# Patient Record
Sex: Male | Born: 1970
Health system: Southern US, Community
[De-identification: ages and names within clinical notes are randomized; demographics above are authoritative.]

## PROBLEM LIST (undated history)

## (undated) DIAGNOSIS — M791 Myalgia, unspecified site: Secondary | ICD-10-CM

## (undated) DIAGNOSIS — T7840XA Allergy, unspecified, initial encounter: Secondary | ICD-10-CM

## (undated) DIAGNOSIS — J302 Other seasonal allergic rhinitis: Secondary | ICD-10-CM

## (undated) DIAGNOSIS — M2550 Pain in unspecified joint: Secondary | ICD-10-CM

## (undated) DIAGNOSIS — K219 Gastro-esophageal reflux disease without esophagitis: Secondary | ICD-10-CM

## (undated) HISTORY — DX: Other seasonal allergic rhinitis: J30.2

## (undated) HISTORY — PX: UPPER GASTROINTESTINAL ENDOSCOPY: SHX188

## (undated) HISTORY — PX: COLONOSCOPY: SHX174

## (undated) HISTORY — DX: Myalgia, unspecified site: M79.10

## (undated) HISTORY — DX: Allergy, unspecified, initial encounter: T78.40XA

## (undated) HISTORY — DX: Gastro-esophageal reflux disease without esophagitis: K21.9

## (undated) HISTORY — DX: Pain in unspecified joint: M25.50

---

## 1998-09-01 HISTORY — PX: NASAL SINUS SURGERY: SHX719

## 2015-11-07 LAB — HM HIV SCREENING LAB: HM HIV Screening: NEGATIVE

## 2016-11-08 LAB — HEPATIC FUNCTION PANEL
ALT: 16 (ref 10–40)
AST: 14 (ref 14–40)
Bilirubin, Total: 0.4

## 2016-11-08 LAB — LIPID PANEL
Cholesterol: 157 (ref 0–200)
HDL: 60 (ref 35–70)
LDL Cholesterol: 82
LDl/HDL Ratio: 1.4
Triglycerides: 74 (ref 40–160)

## 2016-11-08 LAB — BASIC METABOLIC PANEL
BUN: 10 (ref 4–21)
Creatinine: 1.2 (ref 0.6–1.3)
Glucose: 82
Potassium: 4.6 (ref 3.4–5.3)
Sodium: 141 (ref 137–147)

## 2016-11-08 LAB — HEMOGLOBIN A1C: Hemoglobin A1C: 5.1

## 2018-05-21 DIAGNOSIS — H5213 Myopia, bilateral: Secondary | ICD-10-CM | POA: Diagnosis not present

## 2018-05-21 DIAGNOSIS — H40053 Ocular hypertension, bilateral: Secondary | ICD-10-CM | POA: Diagnosis not present

## 2018-08-19 ENCOUNTER — Ambulatory Visit (INDEPENDENT_AMBULATORY_CARE_PROVIDER_SITE_OTHER): Payer: 59 | Admitting: Family Medicine

## 2018-08-19 ENCOUNTER — Encounter: Payer: Self-pay | Admitting: Family Medicine

## 2018-08-19 VITALS — BP 134/90 | HR 72 | Temp 99.1°F | Resp 16 | Ht 73.0 in | Wt 249.0 lb

## 2018-08-19 DIAGNOSIS — M7061 Trochanteric bursitis, right hip: Secondary | ICD-10-CM

## 2018-08-19 DIAGNOSIS — M5416 Radiculopathy, lumbar region: Secondary | ICD-10-CM

## 2018-08-19 DIAGNOSIS — K219 Gastro-esophageal reflux disease without esophagitis: Secondary | ICD-10-CM | POA: Insufficient documentation

## 2018-08-19 DIAGNOSIS — R030 Elevated blood-pressure reading, without diagnosis of hypertension: Secondary | ICD-10-CM

## 2018-08-19 MED ORDER — PREDNISONE 10 MG PO TABS: ORAL_TABLET | ORAL | 0 refills | Status: DC

## 2018-08-19 NOTE — Progress Notes (Signed)
Subjective  CC:   Chief Complaint  Patient presents with  . Establish Care  . Leg Pain    Right side, takes IBU  . Gastroesophageal Reflux    Takes Nexium daily, states without his symptoms are worse  . Back Pain    right side, Takes IBU    HPI: Tristan Merritt is a 47 y.o. male who presents to [place] at [facility] today to establish care with me as a new patient.  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  Resides with his girlfriend. Has 2 sons who live with his ex-wife in [place].  He has the following concerns or needs:  Chronic dysphagia treated with nexium and well controlled. Has had GI eval back in early 2000s with normal EGD with "increased acid production". Since, has required PPI daily. Has tried tums and zantac with little relief. No weight loss, melena, persistent sxs if takes meds. He reports his diet is very healthy for the last year; he has a stressful job. Rare alcohol. Had same problems when was much thinner (during weight loss due to stress of divorce though).  Elevated BP: keeps a log at home: consistent normal readings: 120s/70s-80s. Has strong FH.   C/o 7 months of back and right hip pain. Started with pain at right hip and lateral thigh. Now with right low back pain that radiates to calf. No weakness. No b/b dysfunction. No injury. Sits at work but also walks a lot on concrete floors. Hasn't taken anything for pain. Right hip pain will awaken him from sleep if he rolls on that side.   HM: overdue for CPE; imms are up to date. Reports had TDap at prior PCP about 3 years ago. Will request old records.   Depression screen PHQ 2/9 08/19/2018  Decreased Interest 0  Down, Depressed, Hopeless 0  PHQ - 2 Score 0  Altered sleeping 2  Tired, decreased energy 2  Change in appetite 0  Feeling bad or failure about yourself  0  Trouble concentrating 0  Moving slowly or  fidgety/restless 0  Suicidal thoughts 0  PHQ-9 Score 4  Difficult doing work/chores Somewhat difficult    Assessment  1. GERD without esophagitis   2. White coat syndrome without diagnosis of hypertension   3. Greater trochanteric bursitis of right hip   4. Lumbar radiculitis      Plan   Chronic GERD/dysphagia. Controlled on PPI. Discussed risks vs benefits. rec continuing for now, weight loss, and f/u with GI if wants another opinion.   Back and hip pain: discussed multifactorial: lumbar radiculopathy vs sciatica, hip busitis and tight fascia lata; start stretching icing and steroid taper. Tylenol if needed as well. Recheck 3 weeks. No red flag sxs present. Discussed.   Continue to monitor bp outside of the office.   Follow up:  Return in about 4 weeks (around 09/16/2018) for recheck, complete physical at pt's convenienc. No orders of the defined types were placed in this encounter.  Meds ordered this encounter  Medications  . predniSONE (DELTASONE) 10 MG tablet    Sig: Take 4 tabs qd x 2 days, 3 qd x 2 days, 2 qd x 2d, 1qd x 3 days    Dispense:  21 tablet    Refill:  0     Depression screen [facility] 2/9 08/19/2018  Decreased Interest 0  Down, Depressed, Hopeless 0  PHQ - 2 Score 0  Altered sleeping  2  Tired, decreased energy 2  Change in appetite 0  Feeling bad or failure about yourself  0  Trouble concentrating 0  Moving slowly or fidgety/restless 0  Suicidal thoughts 0  PHQ-9 Score 4  Difficult doing work/chores Somewhat difficult    We updated and reviewed the patient's past history in detail and it is documented below.  Patient Active Problem List   Diagnosis Date Noted  . GERD without esophagitis 08/19/2018    EGD in early 2000s; normal. On PPI; reports problems swallowing if stops PPI   . White coat syndrome without diagnosis of hypertension 08/19/2018   Health Maintenance  Topic Date Due  . HIV Screening  11/28/1984  . TETANUS/TDAP  08/19/2025  .  INFLUENZA VACCINE  Completed   Immunization History  Administered Date(s) Administered  . Influenza,inj,Quad PF,6+ Mos 07/14/2018  . Tdap 08/20/2015   Current Meds  Medication Sig  . cetirizine (ZYRTEC) 10 MG tablet Take 10 mg by mouth daily.  Marland Kitchen esomeprazole (NEXIUM) 20 MG capsule Take 20 mg by mouth daily at 12 noon.    Allergies: Patient is allergic to biaxin [clarithromycin] and sulfa antibiotics. Past Medical History Patient  has a past medical history of GERD (gastroesophageal reflux disease) and Seasonal allergies. Past Surgical History Patient  has a past surgical history that includes Nasal sinus surgery (2000). Family History: Patient family history includes Diabetes in his father; Glaucoma in his brother; HIV in his brother; Healthy in his son and son; Heart disease in his maternal grandfather; Hypertension in his father and paternal grandmother; Lymphoma in his mother; Multiple myeloma in his maternal grandmother; Stroke in his father and paternal grandmother. Social History:  Patient  reports that he has never smoked. He has never used smokeless tobacco. He reports current alcohol use of about 1.0 - 2.0 standard drinks of alcohol per week. He reports that he does not use drugs.  Review of Systems: Constitutional: negative for fever or malaise Ophthalmic: negative for photophobia, double vision or loss of vision Cardiovascular: negative for chest pain, dyspnea on exertion, or new LE swelling Respiratory: negative for SOB or persistent cough Gastrointestinal: negative for abdominal pain, change in bowel habits or melena Genitourinary: negative for dysuria or gross hematuria Musculoskeletal: negative for new gait disturbance or muscular weakness Integumentary: negative for new or persistent rashes Neurological: negative for TIA or stroke symptoms Psychiatric: negative for SI or delusions Allergic/Immunologic: negative for hives  Patient Care Team    Relationship  Specialty Notifications Start End  Leamon Arnt, MD PCP - General Family Medicine  08/19/18     Objective  Vitals: BP 134/90   Pulse 72   Temp 99.1 F (37.3 C) (Oral)   Resp 16   Ht _0  (1.854 m)   Wt 249 lb (112.9 kg)   SpO2 98%   BMI 32.85 kg/m  General:  Well developed, well nourished, no acute distress  Psych:  Alert and oriented,normal mood and affect HEENT:  Normocephalic, atraumatic, non-icteric sclera, PERRL, oropharynx is without mass or exudate, supple neck without adenopathy, mass or thyromegaly Cardiovascular:  RRR without gallop, rub or murmur Respiratory:  Good breath sounds bilaterally, CTAB with normal respiratory effort Gastrointestinal: normal bowel sounds, soft, non-tender, no noted masses. No HSM MSK: no deformities, contusions. Joints are without erythema or swelling + right gr troch bursa ttp, full ROM back; right paraverterbral ttp, no sciatic notch ttp. Normal strength BLE and 5/5 knee reflexes and heel cord reflexes Skin:  Warm, no  rashes or suspicious lesions noted Neurologic:    Mental status is normal. Gross motor and sensory exams are normal. Normal gait    Commons side effects, risks, benefits, and alternatives for medications and treatment plan prescribed today were discussed, and the patient expressed understanding of the given instructions. Patient is instructed to call or message via MyChart if he/she has any questions or concerns regarding our treatment plan. No barriers to understanding were identified. We discussed Red Flag symptoms and signs in detail. Patient expressed understanding regarding what to do in case of urgent or emergency type symptoms.   Medication list was reconciled, printed and provided to the patient in AVS. Patient instructions and summary information was reviewed with the patient as documented in the AVS. This note was prepared with assistance of Dragon voice recognition software. Occasional wrong-word or sound-a-like  substitutions may have occurred due to the inherent limitations of voice recognition software

## 2018-08-19 NOTE — Patient Instructions (Signed)
Please return in 3 weeks to recheck back and leg pain.  Also can schedule a CPE at your convenience, please come fasting.   It was a pleasure meeting you today! Thank you for choosing us to meet your healthcare needs! I truly look forward to working with you. If you have any questions or concerns, please send me a message via Mychart or call the office at [phone].   Sciatica  Sciatica is pain, numbness, weakness, or tingling along the path of the sciatic nerve. The sciatic nerve starts in the lower back and runs down the back of each leg. The nerve controls the muscles in the lower leg and in the back of the knee. It also provides feeling (sensation) to the back of the thigh, the lower leg, and the sole of the foot. Sciatica is a symptom of another medical condition that pinches or puts pressure on the sciatic nerve. Generally, sciatica only affects one side of the body. Sciatica usually goes away on its own or with treatment. In some cases, sciatica may keep coming back (recur). What are the causes? This condition is caused by pressure on the sciatic nerve, or pinching of the sciatic nerve. This may be the result of:  A disk in between the bones of the spine (vertebrae) bulging out too far (herniated disk).  Age-related changes in the spinal disks (degenerative disk disease).  A pain disorder that affects a muscle in the buttock (piriformis syndrome).  Extra bone growth (bone spur) near the sciatic nerve.  An injury or break (fracture) of the pelvis.  Pregnancy.  Tumor (rare). What increases the risk? The following factors may make you more likely to develop this condition:  Playing sports that place pressure or stress on the spine, such as football or weight lifting.  Having poor strength and flexibility.  A history of back injury.  A history of back surgery.  Sitting for long periods of time.  Doing activities that involve repetitive bending or  lifting.  Obesity. What are the signs or symptoms? Symptoms can vary from mild to very severe, and they may include:  Any of these problems in the lower back, leg, hip, or buttock: ? Mild tingling or dull aches. ? Burning sensations. ? Sharp pains.  Numbness in the back of the calf or the sole of the foot.  Leg weakness.  Severe back pain that makes movement difficult. These symptoms may get worse when you cough, sneeze, or laugh, or when you sit or stand for long periods of time. Being overweight may also make symptoms worse. In some cases, symptoms may recur over time. How is this diagnosed? This condition may be diagnosed based on:  Your symptoms.  A physical exam. Your health care provider may ask you to do certain movements to check whether those movements trigger your symptoms.  You may have tests, including: ? Blood tests. ? X-rays. ? MRI. ? CT scan. How is this treated? In many cases, this condition improves on its own, without any treatment. However, treatment may include:  Reducing or modifying physical activity during periods of pain.  Exercising and stretching to strengthen your abdomen and improve the flexibility of your spine.  Icing and applying heat to the affected area.  Medicines that help: ? To relieve pain and swelling. ? To relax your muscles.  Injections of medicines that help to relieve pain, irritation, and inflammation around the sciatic nerve (steroids).  Surgery. Follow these instructions at home: Medicines  Take over-the-counter  and prescription medicines only as told by your health care provider.  Do not drive or operate heavy machinery while taking prescription pain medicine. Managing pain  If directed, apply ice to the affected area. ? Put ice in a plastic bag. ? Place a towel between your skin and the bag. ? Leave the ice on for 20 minutes, 2-3 times a day.  After icing, apply heat to the affected area before you exercise or as  often as told by your health care provider. Use the heat source that your health care provider recommends, such as a moist heat pack or a heating pad. ? Place a towel between your skin and the heat source. ? Leave the heat on for 20-30 minutes. ? Remove the heat if your skin turns bright red. This is especially important if you are unable to feel pain, heat, or cold. You may have a greater risk of getting burned. Activity  Return to your normal activities as told by your health care provider. Ask your health care provider what activities are safe for you. ? Avoid activities that make your symptoms worse.  Take brief periods of rest throughout the day. Resting in a lying or standing position is usually better than sitting to rest. ? When you rest for longer periods, mix in some mild activity or stretching between periods of rest. This will help to prevent stiffness and pain. ? Avoid sitting for long periods of time without moving. Get up and move around at least one time each hour.  Exercise and stretch regularly, as told by your health care provider.  Do not lift anything that is heavier than 10 lb (4.5 kg) while you have symptoms of sciatica. When you do not have symptoms, you should still avoid heavy lifting, especially repetitive heavy lifting.  When you lift objects, always use proper lifting technique, which includes: ? Bending your knees. ? Keeping the load close to your body. ? Avoiding twisting. General instructions  Use good posture. ? Avoid leaning forward while sitting. ? Avoid hunching over while standing.  Maintain a healthy weight. Excess weight puts extra stress on your back and makes it difficult to maintain good posture.  Wear supportive, comfortable shoes. Avoid wearing high heels.  Avoid sleeping on a mattress that is too soft or too hard. A mattress that is firm enough to support your back when you sleep may help to reduce your pain.  Keep all follow-up visits as  told by your health care provider. This is important. Contact a health care provider if:  You have pain that wakes you up when you are sleeping.  You have pain that gets worse when you lie down.  Your pain is worse than you have experienced in the past.  Your pain lasts longer than 4 weeks.  You experience unexplained weight loss. Get help right away if:  You lose control of your bowel or bladder (incontinence).  You have: ? Weakness in your lower back, pelvis, buttocks, or legs that gets worse. ? Redness or swelling of your back. ? A burning sensation when you urinate. This information is not intended to replace advice given to you by your health care provider. Make sure you discuss any questions you have with your health care provider. Document Released: 08/12/2001 Document Revised: 01/22/2016 Document Reviewed: 04/27/2015 Elsevier Interactive Patient Education  2019 Elsevier Inc.  Hip Bursitis  Hip bursitis is inflammation of a fluid-filled sac (bursa) in the hip joint. The bursa protects the bones  in the hip joint from rubbing against each other. Hip bursitis can cause mild to moderate pain, and symptoms often come and go over time. What are the causes? This condition may be caused by:  Injury to the hip.  Overuse of the muscles that surround the hip joint.  Arthritis or gout.  Diabetes.  Thyroid disease.  Cold weather.  Infection. In some cases, the cause may not be known. What are the signs or symptoms? Symptoms of this condition may include:  Mild or moderate pain in the hip area. Pain may get worse with movement.  Tenderness and swelling of the hip, especially on the outer side of the hip. Symptoms may come and go. If the bursa becomes infected, you may have the following symptoms:  Fever.  Red skin and a feeling of warmth in the hip area. How is this diagnosed? This condition may be diagnosed based on:  A physical exam.  Your medical  history.  X-rays.  Removal of fluid from your inflamed bursa for testing (biopsy). You may be sent to a health care provider who specializes in bone diseases (orthopedist) or a provider who specializes in joint inflammation (rheumatologist). How is this treated? This condition is treated by resting, raising (elevating), and applying pressure(compression) to the injured area. In some cases, this may be enough to make your symptoms go away. Treatment may also include:  Crutches.  Antibiotic medicine.  Draining fluid out of the bursa to help relieve swelling.  Injecting medicine that helps to reduce inflammation (cortisone). Follow these instructions at home: Medicines  Take over-the-counter and prescription medicines only as told by your health care provider.  Do not drive or operate heavy machinery while taking prescription pain medicine, or as told by your health care provider.  If you were prescribed an antibiotic, take it as told by your health care provider. Do not stop taking the antibiotic even if you start to feel better. Activity  Return to your normal activities as told by your health care provider. Ask your health care provider what activities are safe for you.  Rest and protect your hip as much as possible until your pain and swelling get better. General instructions  Wear compression wraps only as told by your health care provider.  Elevate your hip above the level of your heart as much as you can without pain. To do this, try putting a pillow under your hips while you lie down.  Do not use your hip to support your body weight until your health care provider says that you can. Use crutches as told by your health care provider.  Gently massage and stretch your injured area as often as is comfortable.  Keep all follow-up visits as told by your health care provider. This is important. How is this prevented?  Exercise regularly, as told by your health care  provider.  Warm up and stretch before being active.  Cool down and stretch after being active.  If an activity irritates your hip or causes pain, avoid the activity as much as possible.  Avoid sitting down for long periods at a time. Contact a health care provider if:  You have a fever.  You develop new symptoms.  You have difficulty walking or doing everyday activities.  You have pain that gets worse or does not get better with medicine.  You develop red skin or a feeling of warmth in your hip area. Get help right away if:  You cannot move your hip.  You have severe pain. This information is not intended to replace advice given to you by your health care provider. Make sure you discuss any questions you have with your health care provider. Document Released: 02/07/2002 Document Revised: 01/24/2016 Document Reviewed: 03/20/2015 Elsevier Interactive Patient Education  2019 [employer] Inc.   Back Exercises The following exercises strengthen the muscles that help to support the back. They also help to keep the lower back flexible. Doing these exercises can help to prevent back pain or lessen existing pain. If you have back pain or discomfort, try doing these exercises 2-3 times each day or as told by your health care provider. When the pain goes away, do them once each day, but increase the number of times that you repeat the steps for each exercise (do more repetitions). If you do not have back pain or discomfort, do these exercises once each day or as told by your health care provider. Exercises Single Knee to Chest Repeat these steps 3-5 times for each leg: 1. Lie on your back on a firm bed or the floor with your legs extended. 2. Bring one knee to your chest. Your other leg should stay extended and in contact with the floor. 3. Hold your knee in place by grabbing your knee or thigh. 4. Pull on your knee until you feel a gentle stretch in your lower back. 5. Hold the stretch  for 10-30 seconds. 6. Slowly release and straighten your leg. Pelvic Tilt Repeat these steps 5-10 times: 1. Lie on your back on a firm bed or the floor with your legs extended. 2. Bend your knees so they are pointing toward the ceiling and your feet are flat on the floor. 3. Tighten your lower abdominal muscles to press your lower back against the floor. This motion will tilt your pelvis so your tailbone points up toward the ceiling instead of pointing to your feet or the floor. 4. With gentle tension and even breathing, hold this position for 5-10 seconds. Cat-Cow Repeat these steps until your lower back becomes more flexible: 1. Get into a hands-and-knees position on a firm surface. Keep your hands under your shoulders, and keep your knees under your hips. You may place padding under your knees for comfort. 2. Let your head hang down, and point your tailbone toward the floor so your lower back becomes rounded like the back of a cat. 3. Hold this position for 5 seconds. 4. Slowly lift your head and point your tailbone up toward the ceiling so your back forms a sagging arch like the back of a cow. 5. Hold this position for 5 seconds.  Press-Ups Repeat these steps 5-10 times: 1. Lie on your abdomen (face-down) on the floor. 2. Place your palms near your head, about shoulder-width apart. 3. While you keep your back as relaxed as possible and keep your hips on the floor, slowly straighten your arms to raise the top half of your body and lift your shoulders. Do not use your back muscles to raise your upper torso. You may adjust the placement of your hands to make yourself more comfortable. 4. Hold this position for 5 seconds while you keep your back relaxed. 5. Slowly return to lying flat on the floor.  Bridges Repeat these steps 10 times: 1. Lie on your back on a firm surface. 2. Bend your knees so they are pointing toward the ceiling and your feet are flat on the floor. 3. Tighten your  buttocks muscles and lift your  buttocks off of the floor until your waist is at almost the same height as your knees. You should feel the muscles working in your buttocks and the back of your thighs. If you do not feel these muscles, slide your feet 1-2 inches farther away from your buttocks. 4. Hold this position for 3-5 seconds. 5. Slowly lower your hips to the starting position, and allow your buttocks muscles to relax completely. If this exercise is too easy, try doing it with your arms crossed over your chest. Abdominal Crunches Repeat these steps 5-10 times: 1. Lie on your back on a firm bed or the floor with your legs extended. 2. Bend your knees so they are pointing toward the ceiling and your feet are flat on the floor. 3. Cross your arms over your chest. 4. Tip your chin slightly toward your chest without bending your neck. 5. Tighten your abdominal muscles and slowly raise your trunk (torso) high enough to lift your shoulder blades a tiny bit off of the floor. Avoid raising your torso higher than that, because it can put too much stress on your low back and it does not help to strengthen your abdominal muscles. 6. Slowly return to your starting position. Back Lifts Repeat these steps 5-10 times: 1. Lie on your abdomen (face-down) with your arms at your sides, and rest your forehead on the floor. 2. Tighten the muscles in your legs and your buttocks. 3. Slowly lift your chest off of the floor while you keep your hips pressed to the floor. Keep the back of your head in line with the curve in your back. Your eyes should be looking at the floor. 4. Hold this position for 3-5 seconds. 5. Slowly return to your starting position. Contact a health care provider if:  Your back pain or discomfort gets much worse when you do an exercise.  Your back pain or discomfort does not lessen within 2 hours after you exercise. If you have any of these problems, stop doing these exercises right away. Do  not do them again unless your health care provider says that you can. Get help right away if:  You develop sudden, severe back pain. If this happens, stop doing the exercises right away. Do not do them again unless your health care provider says that you can. This information is not intended to replace advice given to you by your health care provider. Make sure you discuss any questions you have with your health care provider. Document Released: 09/25/2004 Document Revised: 12/22/2017 Document Reviewed: 10/12/2014 Elsevier Interactive Patient Education  [employer]2019 [employer]

## 2018-09-08 ENCOUNTER — Encounter: Payer: Self-pay | Admitting: *Deleted

## 2018-09-08 NOTE — Progress Notes (Signed)
[id number] 

## 2018-09-14 ENCOUNTER — Ambulatory Visit (INDEPENDENT_AMBULATORY_CARE_PROVIDER_SITE_OTHER): Payer: 59 | Admitting: Family Medicine

## 2018-09-14 ENCOUNTER — Encounter: Payer: Self-pay | Admitting: Family Medicine

## 2018-09-14 ENCOUNTER — Other Ambulatory Visit: Payer: Self-pay

## 2018-09-14 VITALS — BP 126/80 | HR 68 | Temp 98.0°F | Resp 16 | Ht 73.0 in | Wt 252.4 lb

## 2018-09-14 DIAGNOSIS — R030 Elevated blood-pressure reading, without diagnosis of hypertension: Secondary | ICD-10-CM

## 2018-09-14 DIAGNOSIS — Z0000 Encounter for general adult medical examination without abnormal findings: Secondary | ICD-10-CM

## 2018-09-14 DIAGNOSIS — M5416 Radiculopathy, lumbar region: Secondary | ICD-10-CM | POA: Diagnosis not present

## 2018-09-14 DIAGNOSIS — M7061 Trochanteric bursitis, right hip: Secondary | ICD-10-CM | POA: Diagnosis not present

## 2018-09-14 LAB — CBC WITH DIFFERENTIAL/PLATELET
Basophils Absolute: 0 10*3/uL (ref 0.0–0.1)
Basophils Relative: 0.8 % (ref 0.0–3.0)
Eosinophils Absolute: 0.2 10*3/uL (ref 0.0–0.7)
Eosinophils Relative: 4 % (ref 0.0–5.0)
HCT: 41.7 % (ref 39.0–52.0)
Hemoglobin: 14.1 g/dL (ref 13.0–17.0)
Lymphocytes Relative: 32.1 % (ref 12.0–46.0)
Lymphs Abs: 1.8 10*3/uL (ref 0.7–4.0)
MCHC: 33.8 g/dL (ref 30.0–36.0)
MCV: 86.8 fl (ref 78.0–100.0)
Monocytes Absolute: 0.5 10*3/uL (ref 0.1–1.0)
Monocytes Relative: 8.4 % (ref 3.0–12.0)
Neutro Abs: 3.1 10*3/uL (ref 1.4–7.7)
Neutrophils Relative %: 54.7 % (ref 43.0–77.0)
Platelets: 278 10*3/uL (ref 150.0–400.0)
RBC: 4.81 Mil/uL (ref 4.22–5.81)
RDW: 13.1 % (ref 11.5–15.5)
WBC: 5.7 10*3/uL (ref 4.0–10.5)

## 2018-09-14 LAB — COMPREHENSIVE METABOLIC PANEL
ALT: 19 U/L (ref 0–53)
AST: 16 U/L (ref 0–37)
Albumin: 4.4 g/dL (ref 3.5–5.2)
Alkaline Phosphatase: 58 U/L (ref 39–117)
BUN: 18 mg/dL (ref 6–23)
CO2: 28 mEq/L (ref 19–32)
Calcium: 9.3 mg/dL (ref 8.4–10.5)
Chloride: 102 mEq/L (ref 96–112)
Creatinine, Ser: 1.13 mg/dL (ref 0.40–1.50)
GFR: 73.37 mL/min (ref >60.00)
Glucose, Bld: 104 mg/dL — ABNORMAL HIGH (ref 70–99)
Potassium: 4.1 mEq/L (ref 3.5–5.1)
Sodium: 139 mEq/L (ref 135–145)
Total Bilirubin: 0.5 mg/dL (ref 0.2–1.2)
Total Protein: 6.9 g/dL (ref 6.0–8.3)

## 2018-09-14 LAB — LIPID PANEL
Cholesterol: 185 mg/dL (ref 0–200)
HDL: 52.3 mg/dL (ref >39.00)
LDL Cholesterol: 119 mg/dL — ABNORMAL HIGH (ref 0–99)
NonHDL: 133.06
Total CHOL/HDL Ratio: 4
Triglycerides: 68 mg/dL (ref 0.0–149.0)
VLDL: 13.6 mg/dL (ref 0.0–40.0)

## 2018-09-14 MED ORDER — GABAPENTIN 300 MG PO CAPS: 300.0000 mg | ORAL_CAPSULE | Freq: Every day | ORAL | 0 refills | Status: DC

## 2018-09-14 NOTE — Patient Instructions (Signed)
Please return in 12 months for your annual complete physical; please come fasting.  I will release your lab results to you on your MyChart account with further instructions. Please reply with any questions.   We will call you with information regarding your referral appointment. [facility] for your back pain. If you do not hear from us within the next 2 weeks, please let me know. It can take 1-2 weeks to get appointments set up with the specialists.    If you have any questions or concerns, please don't hesitate to send me a message via MyChart or call the office at [phone]. Thank you for visiting with us today! It's our pleasure caring for you.  Please do these things to maintain good health!   Exercise at least 30-45 minutes a day,  4-5 days a week.   Eat a low-fat diet with lots of fruits and vegetables, up to 7-9 servings per day.  Drink plenty of water daily. Try to drink 8 8oz glasses per day.  Seatbelts can save your life. Always wear your seatbelt.  Place Smoke Detectors on every level of your home and check batteries every year.  Eye Doctor - have an eye exam every 1-2 years  Safe sex - use condoms to protect yourself from STDs if you could be exposed to these types of infections.  Avoid heavy alcohol use. If you drink, keep it to less than 2 drinks/day and not every day.  Health Care Power of Attorney.  Choose someone you trust that could speak for you if you became unable to speak for yourself.  Depression is common in our stressful world.If you're feeling down or losing interest in things you normally enjoy, please come in for a visit.

## 2018-09-14 NOTE — Progress Notes (Signed)
Subjective  Chief Complaint  Patient presents with  . Annual Exam    Pt is fasting  . Hip Pain    Right side, he states that while on prednisone pain improved, since completing TX his pain is starting to return. He has been doing the stretches.    HPI: Tristan Merritt is a 48 y.o. male who presents to [place] [facility] today for a Male Wellness Visit.   Wellness Visit: annual visit with health maintenance review and exam    Doing well. Working on eating better now that holidays are over. GERD is well controlled. Trying to minimize ibuprofen  Back and hip pain: did well with prednisone but sxs are returning; dull ache in low back and some radicular sxs w/o weakness. No stiffness or weakness.  Lifestyle: Body mass index is 33.3 kg/m. Wt Readings from Last 3 Encounters:  09/14/18 252 lb 6.4 oz (114.5 kg)  08/19/18 249 lb (112.9 kg)   Diet: low fat Exercise: intermittently,   Patient Active Problem List   Diagnosis Date Noted  . GERD without esophagitis 08/19/2018  . White coat syndrome without diagnosis of hypertension 08/19/2018   Health Maintenance  Topic Date Due  . TETANUS/TDAP  11/06/2025  . INFLUENZA VACCINE  Completed  . HIV Screening  Completed   Immunization History  Administered Date(s) Administered  . Influenza,inj,Quad PF,6+ Mos 07/14/2018  . Tdap 11/07/2015   We updated and reviewed the patient's past history in detail and it is documented below. Allergies: Patient is allergic to biaxin [clarithromycin] and sulfa antibiotics. Past Medical History  has a past medical history of GERD (gastroesophageal reflux disease) and Seasonal allergies. Past Surgical History  has a past surgical history that includes Nasal sinus surgery (2000). Social History Patient  reports that he has never smoked. He has never used smokeless tobacco. He reports current alcohol use of about 1.0 - 2.0 standard drinks of alcohol per week. He reports that he  does not use drugs. Family History Patient family history includes Diabetes in his father; Glaucoma in his brother; HIV in his brother; Healthy in his son and son; Heart disease in his maternal grandfather; Hypertension in his father and paternal grandmother; Lymphoma in his mother; Multiple myeloma in his maternal grandmother; Stroke in his father and paternal grandmother. Review of Systems: Constitutional: negative for fever or malaise Ophthalmic: negative for photophobia, double vision or loss of vision Cardiovascular: negative for chest pain, dyspnea on exertion, or new LE swelling Respiratory: negative for SOB or persistent cough Gastrointestinal: negative for abdominal pain, change in bowel habits or melena Genitourinary: negative for dysuria or gross hematuria Musculoskeletal: negative for new gait disturbance or muscular weakness Integumentary: negative for new or persistent rashes, no breast lumps Neurological: negative for TIA or stroke symptoms Psychiatric: negative for SI or delusions Allergic/Immunologic: negative for hives  Patient Care Team    Relationship Specialty Notifications Start End  Leamon Arnt, MD PCP - General Family Medicine  08/19/18    Objective  Vitals: BP 126/80   Pulse 68   Temp 98 F (36.7 C) (Oral)   Resp 16   Ht _0  (1.854 m)   Wt 252 lb 6.4 oz (114.5 kg)   SpO2 98%   BMI 33.30 kg/m  General:  Well developed, well nourished, no acute distress  Psych:  Alert and orientedx3,normal mood and affect HEENT:  Normocephalic, atraumatic, non-icteric sclera, PERRL, oropharynx is clear without mass or exudate, supple neck without adenopathy, mass  or thyromegaly Cardiovascular:  Normal S1, S2, RRR without gallop, rub or murmur, nondisplaced PMI, +2 distal pulses in bilateral upper and lower extremities. Respiratory:  Good breath sounds bilaterally, CTAB with normal respiratory effort Gastrointestinal: normal bowel sounds, soft, non-tender, no noted  masses. No HSM MSK: no deformities, contusions. Joints are without erythema or swelling. Spine and CVA region are nontender, neg SLR B, neg hip bursa ttp today Skin:  Warm, no rashes or suspicious lesions noted Neurologic:    Mental status is normal. CN 2-11 are normal. Gross motor and sensory exams are normal. Stable gait. No tremor GU: No inguinal hernias or adenopathy are appreciated bilaterally  Assessment  1. Annual physical exam   2. White coat syndrome without diagnosis of hypertension   3. Lumbar radiculitis   4. Greater trochanteric bursitis of right hip      Plan  Male Wellness Visit:  Age appropriate Health Maintenance and Prevention measures were discussed with patient. Included topics are cancer screening recommendations, ways to keep healthy (see AVS) including dietary and exercise recommendations, regular eye and dental care, use of seat belts, and avoidance of moderate alcohol use and tobacco use.   BMI: discussed patient's BMI and encouraged positive lifestyle modifications to help get to or maintain a target BMI.  HM needs and immunizations were addressed and ordered. See below for orders. See HM and immunization section for updates.  Routine labs and screening tests ordered including cmp, cbc and lipids where appropriate.  Discussed recommendations regarding Vit D and calcium supplementation (see AVS)  Refer to [facility] for eval of back and hip pain. May warrant PT as well. Start neurontin nightly.  Follow up: Return in about 1 year (around 09/15/2019) for complete physical.   Commons side effects, risks, benefits, and alternatives for medications and treatment plan prescribed today were discussed, and the patient expressed understanding of the given instructions. Patient is instructed to call or message via MyChart if he/she has any questions or concerns regarding our treatment plan. No barriers to understanding were identified. We discussed Red Flag symptoms and signs in  detail. Patient expressed understanding regarding what to do in case of urgent or emergency type symptoms.   Medication list was reconciled, printed and provided to the patient in AVS. Patient instructions and summary information was reviewed with the patient as documented in the AVS. This note was prepared with assistance of Dragon voice recognition software. Occasional wrong-word or sound-a-like substitutions may have occurred due to the inherent limitations of voice recognition software  Orders Placed This Encounter  Procedures  . CBC with Differential/Platelet  . Comprehensive metabolic panel  . Lipid panel  . HIV Antibody (routine testing w rflx)  . Ambulatory referral to Sports Medicine   Meds ordered this encounter  Medications  . gabapentin (NEURONTIN) 300 MG capsule    Sig: Take 1 capsule (300 mg total) by mouth at bedtime.    Dispense:  90 capsule    Refill:  0

## 2018-09-15 LAB — HIV ANTIBODY (ROUTINE TESTING W REFLEX): HIV 1&2 Ab, 4th Generation: NONREACTIVE

## 2018-09-17 ENCOUNTER — Encounter: Payer: Self-pay | Admitting: Family Medicine

## 2018-10-01 ENCOUNTER — Ambulatory Visit: Payer: 59 | Admitting: Family Medicine

## 2018-10-09 NOTE — Progress Notes (Signed)
[facility] [address], [place] [address] Phone: [phone] Subjective:   Tristan Merritt, am serving as a scribe for Dr. Hulan Saas.  I'm seeing this patient by the request  of:  Leamon Arnt, MD   CC: Hip pain  [id number]  Tristan Merritt is a 48 y.o. male coming in with complaint of right hip pain. Patient states that he has been having pain for 6-8 months in the right hip. States that his desk chair had plastic part that irritated the lateral hip. Patient also was trying to walk more during that time. Patient also notes pain the tibialis anterior that correlates with his hip pain. Pain is located over the GT. Has hard time sleeping due to pain. Used gabapentin which alleviated his pain for a few days. Does still use it but it isn't as effective he states. Does note that stretching helps to alleviate pain.         Past Medical History:  Diagnosis Date  . GERD (gastroesophageal reflux disease)   . Seasonal allergies    Past Surgical History:  Procedure Laterality Date  . NASAL SINUS SURGERY  2000   Remove cyst   Social History   Socioeconomic History  . Marital status: Divorced    Spouse name: girlfriend, Freddy Finner  . Number of children: 2  . Years of education: Not on file  . Highest education level: Not on file  Occupational History  . 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 Financial resource strain: Not on file  . Food insecurity:    Worry: Not on file    Inability: Not on file  . Transportation needs:    Medical: Not on file    Non-medical: Not on file  Tobacco Use  . Smoking status: Never Smoker  . Smokeless tobacco: Never Used  Substance and Sexual Activity  . Alcohol use: Yes    Alcohol/week: 1.0 - 2.0 standard drinks    Types: 1 - 2 Cans of beer per week  . Drug use: Never  . Sexual activity: Yes  Lifestyle  . Physical activity:    Days per week: Not on file      Minutes per session: Not on file  . Stress: Not on file  Relationships  . Social connections:    Talks on phone: Not on file    Gets together: Not on file    Attends religious service: Not on file    Active member of club or organization: Not on file    Attends meetings of clubs or organizations: Not on file    Relationship status: Not on file  Other Topics Concern  . Not on file  Social History Narrative  . Not on file   Allergies  Allergen Reactions  . Biaxin [Clarithromycin] Nausea And Vomiting  . Sulfa Antibiotics Hives   Family History  Problem Relation Age of Onset  . Lymphoma Mother   . Stroke Father   . Hypertension Father   . Diabetes Father        Borderline  . Multiple myeloma Maternal Grandmother   . Heart disease Maternal Grandfather   . Hypertension Paternal Grandmother   . Stroke Paternal Grandmother   . HIV Brother   . Glaucoma Brother   . Healthy Son   . Healthy Son       Current Outpatient Medications (Respiratory):  .  cetirizine (ZYRTEC) 10 MG tablet,  Take 10 mg by mouth daily.    Current Outpatient Medications (Other):  .  esomeprazole (NEXIUM) 20 MG capsule, Take 20 mg by mouth daily at 12 noon. .  gabapentin (NEURONTIN) 300 MG capsule, Take 1 capsule (300 mg total) by mouth at bedtime.    Past medical history, social, surgical and family history all reviewed in electronic medical record.  Merritt pertanent information unless stated regarding to the chief complaint.   Review of Systems:  Merritt headache, visual changes, nausea, vomiting, diarrhea, constipation, dizziness, abdominal pain, skin rash, fevers, chills, night sweats, weight loss, swollen lymph nodes, body aches, joint swelling, muscle aches, chest pain, shortness of breath, mood changes.   Objective  Blood pressure 122/82, pulse 91, height '6\' 1"'  (1.854 m), weight 259 lb (117.5 kg), SpO2 96 %.    General: Merritt apparent distress alert and oriented x3 mood and affect normal, dressed  appropriately.  HEENT: Pupils equal, extraocular movements intact  Respiratory: Patient's speak in full sentences and does not appear short of breath  Cardiovascular: Merritt lower extremity edema, non tender, Merritt erythema  Skin: Warm dry intact with Merritt signs of infection or rash on extremities or on axial skeleton.  Abdomen: Soft nontender  Neuro: Cranial nerves II through XII are intact, neurovascularly intact in all extremities with 2+ DTRs and 2+ pulses.  Lymph: Merritt lymphadenopathy of posterior or anterior cervical chain or axillae bilaterally.  Gait normal with good balance and coordination.  MSK:  Non tender with full range of motion and good stability and symmetric strength and tone of shoulders, elbows, wrist, hip, knee and ankles bilaterally.  Hip: Right ROM IR: 25 Deg, ER: 45 Deg, Flexion: 120 Deg, Extension: 100 Deg, Abduction: 45 Deg, Adduction: 45 Deg Strength IR: 5/5, ER: 5/5, Flexion: 5/5, Extension: 5/5, Abduction: 4/5, Adduction: 5/5 Pelvic alignment unremarkable to inspection and palpation. Standing hip rotation and gait without trendelenburg sign / unsteadiness. Greater trochanter without tenderness to palpation. Merritt tenderness over piriformis and greater trochanter. Merritt pain with FABER or FADIR. Merritt SI joint tenderness and normal minimal SI movement.  Procedure: Real-time Ultrasound Guided Injection of right hip Device: GE Logiq Q7  Ultrasound guided injection is preferred based studies that show increased duration, increased effect, greater accuracy, decreased procedural pain, increased response rate with ultrasound guided versus blind injection.  Verbal informed consent obtained.  Time-out conducted.  Noted Merritt overlying erythema, induration, or other signs of local infection.  Skin prepped in a sterile fashion.  Local anesthesia: Topical Ethyl chloride.  With sterile technique and under real time ultrasound guidance:  Anterior capsule visualized, needle visualized going to  the head neck junction at the anterior capsule. Pictures taken. Patient did have injection of  3 cc of 0.5% Marcaine, and 1 cc of Kenalog 40 mg/dL. Completed without difficulty  Pain immediately resolved suggesting accurate placement of the medication.  Advised to call if fevers/chills, erythema, induration, drainage, or persistent bleeding.  Images permanently stored and available for review in the ultrasound unit.  Impression: Technically successful ultrasound guided injection.  [id number]; 15 additional minutes spent for Therapeutic exercises as stated in above notes.  This included exercises focusing on stretching, strengthening, with significant focus on eccentric aspects.   Long term goals include an improvement in range of motion, strength, endurance as well as avoiding reinjury. Patient's frequency would include in 1-2 times a day, 3-5 times a week for a duration of 6-12 weeks. Hip strengthening exercises which included:  Pelvic tilt/bracing to help with proper  recruitment of the lower abs and pelvic floor muscles  Glute strengthening to properly contract glutes without over-engaging low back and hamstrings - prone hip extension and glute bridge exercises Proper stretching techniques to increase effectiveness for the hip flexors, groin, quads, piriformic and low back when appropriate    Proper technique shown and discussed handout in great detail with ATC.  All questions were discussed and answered.     Impression and Recommendations:     This case required medical decision making of moderate complexity. The above documentation has been reviewed and is accurate and complete Lyndal Pulley, DO       Note: This dictation was prepared with Dragon dictation along with smaller phrase technology. Any transcriptional errors that result from this process are unintentional.

## 2018-10-11 ENCOUNTER — Encounter: Payer: Self-pay | Admitting: Family Medicine

## 2018-10-11 ENCOUNTER — Ambulatory Visit: Payer: Self-pay

## 2018-10-11 ENCOUNTER — Ambulatory Visit (INDEPENDENT_AMBULATORY_CARE_PROVIDER_SITE_OTHER)
Admission: RE | Admit: 2018-10-11 | Discharge: 2018-10-11 | Disposition: A | Discharge status: Home / Self Care | Payer: 59 | Source: Ambulatory Visit | Attending: Family Medicine | Admitting: Family Medicine

## 2018-10-11 ENCOUNTER — Ambulatory Visit (INDEPENDENT_AMBULATORY_CARE_PROVIDER_SITE_OTHER): Payer: 59 | Admitting: Family Medicine

## 2018-10-11 VITALS — BP 122/82 | HR 91 | Ht 73.0 in | Wt 259.0 lb

## 2018-10-11 DIAGNOSIS — M7061 Trochanteric bursitis, right hip: Secondary | ICD-10-CM | POA: Insufficient documentation

## 2018-10-11 DIAGNOSIS — M25551 Pain in right hip: Secondary | ICD-10-CM

## 2018-10-11 DIAGNOSIS — M2142 Flat foot [pes planus] (acquired), left foot: Secondary | ICD-10-CM | POA: Diagnosis not present

## 2018-10-11 DIAGNOSIS — M2141 Flat foot [pes planus] (acquired), right foot: Secondary | ICD-10-CM | POA: Diagnosis not present

## 2018-10-11 DIAGNOSIS — M2140 Flat foot [pes planus] (acquired), unspecified foot: Secondary | ICD-10-CM | POA: Insufficient documentation

## 2018-10-11 NOTE — Patient Instructions (Signed)
Good to see you  Ice 20 minutes 2 times daily. Usually after activity and before bed. Exercises 3 times a week.  pennsaid pinkie amount topically 2 times daily as needed.  Continue the gabapentin  Stay active but things like biking, elliptical may be good.  Vitamin D 2000 Iu daily  See me again in 4-6 weeks

## 2018-10-11 NOTE — Assessment & Plan Note (Signed)
Discussed shoes.  Discussed icing regimen, discussed avoiding being barefoot and metatarsal longitudinal support.  Follow-up again in 4 weeks

## 2018-10-11 NOTE — Assessment & Plan Note (Signed)
Patient given injection.  Tolerated the procedure well.  Discussed icing regimen and home exercise.  Discussed which activities to do which will still avoid.  Increase activity slowly over the course of neck several days.  Work with Event organiser.  X-rays ordered today.  Discussed proper shoes and over-the-counter orthotics.  Follow-up again in 4 to 8 weeks

## 2018-11-15 ENCOUNTER — Ambulatory Visit: Payer: 59 | Admitting: Family Medicine

## 2018-12-08 ENCOUNTER — Other Ambulatory Visit: Payer: Self-pay | Admitting: Family Medicine

## 2018-12-12 NOTE — Progress Notes (Deleted)
[facility] [address], [place] [address] Phone: [phone] Subjective:    I'm seeing this patient by the request  of:    CC: Bilateral foot pain follow-up  [id number]  Tristan Merritt is a 48 y.o. male coming in with complaint of ***  Onset-  Location Duration-  Character- Aggravating factors- Reliving factors-  Therapies tried-  Severity-     Past Medical History:  Diagnosis Date  . GERD (gastroesophageal reflux disease)   . Seasonal allergies    Past Surgical History:  Procedure Laterality Date  . NASAL SINUS SURGERY  2000   Remove cyst   Social History   Socioeconomic History  . Marital status: Divorced    Spouse name: girlfriend, Freddy Finner  . Number of children: 2  . Years of education: Not on file  . Highest education level: Not on file  Occupational History  . 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 Financial resource strain: Not on file  . Food insecurity:    Worry: Not on file    Inability: Not on file  . Transportation needs:    Medical: Not on file    Non-medical: Not on file  Tobacco Use  . Smoking status: Never Smoker  . Smokeless tobacco: Never Used  Substance and Sexual Activity  . Alcohol use: Yes    Alcohol/week: 1.0 - 2.0 standard drinks    Types: 1 - 2 Cans of beer per week  . Drug use: Never  . Sexual activity: Yes  Lifestyle  . Physical activity:    Days per week: Not on file    Minutes per session: Not on file  . Stress: Not on file  Relationships  . Social connections:    Talks on phone: Not on file    Gets together: Not on file    Attends religious service: Not on file    Active member of club or organization: Not on file    Attends meetings of clubs or organizations: Not on file    Relationship status: Not on file  Other Topics Concern  . Not on file  Social History Narrative  . Not on file   Allergies  Allergen Reactions  .  Biaxin [Clarithromycin] Nausea And Vomiting  . Sulfa Antibiotics Hives   Family History  Problem Relation Age of Onset  . Lymphoma Mother   . Stroke Father   . Hypertension Father   . Diabetes Father        Borderline  . Multiple myeloma Maternal Grandmother   . Heart disease Maternal Grandfather   . Hypertension Paternal Grandmother   . Stroke Paternal Grandmother   . HIV Brother   . Glaucoma Brother   . Healthy Son   . Healthy Son       Current Outpatient Medications (Respiratory):  .  cetirizine (ZYRTEC) 10 MG tablet, Take 10 mg by mouth daily.    Current Outpatient Medications (Other):  .  esomeprazole (NEXIUM) 20 MG capsule, Take 20 mg by mouth daily at 12 noon. .  gabapentin (NEURONTIN) 300 MG capsule, TAKE ONE CAPSULE BY MOUTH AT BEDTIME    Past medical history, social, surgical and family history all reviewed in electronic medical record.  No pertanent information unless stated regarding to the chief complaint.   Review of Systems:  No headache, visual changes, nausea, vomiting, diarrhea, constipation, dizziness, abdominal pain, skin rash, fevers, chills, night sweats, weight loss, swollen  lymph nodes, body aches, joint swelling, muscle aches, chest pain, shortness of breath, mood changes.   Objective  There were no vitals taken for this visit. Systems examined below as of    General: No apparent distress alert and oriented x3 mood and affect normal, dressed appropriately.  HEENT: Pupils equal, extraocular movements intact  Respiratory: Patient's speak in full sentences and does not appear short of breath  Cardiovascular: No lower extremity edema, non tender, no erythema  Skin: Warm dry intact with no signs of infection or rash on extremities or on axial skeleton.  Abdomen: Soft nontender  Neuro: Cranial nerves II through XII are intact, neurovascularly intact in all extremities with 2+ DTRs and 2+ pulses.  Lymph: No lymphadenopathy of posterior or anterior  cervical chain or axillae bilaterally.  Gait normal with good balance and coordination.  MSK:  Non tender with full range of motion and good stability and symmetric strength and tone of shoulders, elbows, wrist, hip, knee and ankles bilaterally.     Impression and Recommendations:     This case required medical decision making of moderate complexity. The above documentation has been reviewed and is accurate and complete Lyndal Pulley, DO       Note: This dictation was prepared with Dragon dictation along with smaller phrase technology. Any transcriptional errors that result from this process are unintentional.

## 2018-12-13 ENCOUNTER — Encounter

## 2018-12-13 ENCOUNTER — Ambulatory Visit: Payer: 59 | Admitting: Family Medicine

## 2019-01-21 DIAGNOSIS — H40053 Ocular hypertension, bilateral: Secondary | ICD-10-CM | POA: Diagnosis not present

## 2019-03-23 ENCOUNTER — Encounter: Payer: Self-pay | Admitting: Family Medicine

## 2019-03-23 ENCOUNTER — Ambulatory Visit (INDEPENDENT_AMBULATORY_CARE_PROVIDER_SITE_OTHER): Payer: 59 | Admitting: Family Medicine

## 2019-03-23 VITALS — BP 122/78

## 2019-03-23 DIAGNOSIS — H8110 Benign paroxysmal vertigo, unspecified ear: Secondary | ICD-10-CM

## 2019-03-23 DIAGNOSIS — J302 Other seasonal allergic rhinitis: Secondary | ICD-10-CM | POA: Diagnosis not present

## 2019-03-23 NOTE — Progress Notes (Signed)
Virtual Visit via Video Note  Subjective  CC:  Chief Complaint  Patient presents with  . Dizziness    Switched from Zyrtec to Claritin last Wed, and dizziness started Saturday. Denies nausea, vomiting, and headaches. Dizziness first times in the morning and last about 3 seconds.      I connected with [name] on 03/23/19 at  3:20 PM EDT by a video enabled telemedicine application and verified that I am speaking with the correct person using two identifiers. Location patient: Home Location provider: [facility] [place], Office Persons participating in the virtual visit: Jaeson Molstad, Leamon Arnt, MD Lilli Light, [place] discussed the limitations of evaluation and management by telemedicine and the availability of in person appointments. The patient expressed understanding and agreed to proceed. HPI: Tristan Merritt is a 48 y.o. male who was contacted today to address the problems listed above in the chief complaint. . As above, fleeting vertigo associated with getting up from lying position x last 4 mornings. sxs resolve spontaneously, then feel fine. No n/v/ headaches, or neuro sxs. Feels well now. No recent URI illnesses. No ear pain or fullness. No balance problems. New problem.  . AR: trying claritin but not working as well as zyrtec did. He had been on zyrtec for years and thought it would be prudent to switch.   Assessment  1. Benign paroxysmal positional vertigo, unspecified laterality   2. Chronic seasonal allergic rhinitis      Plan   Mild bppv:  No red flags. Supportive care and reassurance. F/u if worsens.  AR: change back to zyrtec. Doubt change is related to sxs.  I discussed the assessment and treatment plan with the patient. The patient was provided an opportunity to ask questions and all were answered. The patient agreed with the plan and demonstrated an understanding of the instructions.   The patient was advised to  call back or seek an in-person evaluation if the symptoms worsen or if the condition fails to improve as anticipated. Follow up: No follow-ups on file.  09/16/2019  No orders of the defined types were placed in this encounter.     I reviewed the patients updated PMH, FH, and SocHx.    Patient Active Problem List   Diagnosis Date Noted  . Chronic seasonal allergic rhinitis 03/23/2019  . Greater trochanteric bursitis of right hip 10/11/2018  . Pes planus 10/11/2018  . GERD without esophagitis 08/19/2018  . White coat syndrome without diagnosis of hypertension 08/19/2018   Current Meds  Medication Sig  . esomeprazole (NEXIUM) 20 MG capsule Take 20 mg by mouth daily at 12 noon.  . gabapentin (NEURONTIN) 300 MG capsule TAKE ONE CAPSULE BY MOUTH AT BEDTIME  . loratadine (CLARITIN) 10 MG tablet Take 10 mg by mouth daily.  . magnesium 30 MG tablet Take 30 mg by mouth 2 (two) times daily.  . [DISCONTINUED] cetirizine (ZYRTEC) 10 MG tablet Take 10 mg by mouth daily.    Allergies: Patient is allergic to biaxin [clarithromycin] and sulfa antibiotics. Family History: Patient family history includes Diabetes in his father; Glaucoma in his brother; HIV in his brother; Healthy in his son and son; Heart disease in his maternal grandfather; Hypertension in his father and paternal grandmother; Lymphoma in his mother; Multiple myeloma in his maternal grandmother; Stroke in his father and paternal grandmother. Social History:  Patient  reports that he has never smoked. He has never used smokeless tobacco. He reports  current alcohol use of about 1.0 - 2.0 standard drinks of alcohol per week. He reports that he does not use drugs.  Review of Systems: Constitutional: Negative for fever malaise or anorexia Cardiovascular: negative for chest pain Respiratory: negative for SOB or persistent cough Gastrointestinal: negative for abdominal pain  OBJECTIVE Vitals: BP 122/78  General: no acute distress ,  A&Ox3  Leamon Arnt, MD

## 2019-06-07 ENCOUNTER — Other Ambulatory Visit: Payer: Self-pay | Admitting: Family Medicine

## 2019-09-16 ENCOUNTER — Encounter: Payer: 59 | Admitting: Family Medicine

## 2019-09-23 ENCOUNTER — Other Ambulatory Visit: Payer: Self-pay

## 2020-08-09 ENCOUNTER — Telehealth: Payer: Self-pay

## 2020-08-09 NOTE — Telephone Encounter (Signed)
Patient is calling asking to do a TOC from Dr.Andy to Dr.Parker as he would feel more comfortable with a male doctor.

## 2020-08-09 NOTE — Telephone Encounter (Signed)
sure

## 2020-08-09 NOTE — Telephone Encounter (Signed)
That is ok.  Tristan Merritt. [name], MD 08/09/2020 1:02 PM

## 2020-08-09 NOTE — Telephone Encounter (Signed)
LVM asking patient to call back.  

## 2020-08-19 ENCOUNTER — Encounter: Payer: Self-pay | Admitting: Family Medicine

## 2020-08-26 ENCOUNTER — Encounter: Payer: Self-pay | Admitting: Family Medicine

## 2020-10-22 ENCOUNTER — Encounter: Payer: Self-pay | Admitting: Family Medicine

## 2020-10-22 ENCOUNTER — Other Ambulatory Visit: Payer: Self-pay

## 2020-10-22 ENCOUNTER — Ambulatory Visit (INDEPENDENT_AMBULATORY_CARE_PROVIDER_SITE_OTHER): Payer: BC Managed Care – PPO | Admitting: Family Medicine

## 2020-10-22 ENCOUNTER — Other Ambulatory Visit: Payer: Self-pay | Admitting: Family Medicine

## 2020-10-22 VITALS — BP 122/81 | HR 83 | Temp 98.4°F | Ht 73.0 in | Wt 263.8 lb

## 2020-10-22 DIAGNOSIS — Z1211 Encounter for screening for malignant neoplasm of colon: Secondary | ICD-10-CM

## 2020-10-22 DIAGNOSIS — Z1159 Encounter for screening for other viral diseases: Secondary | ICD-10-CM

## 2020-10-22 DIAGNOSIS — H409 Unspecified glaucoma: Secondary | ICD-10-CM | POA: Diagnosis not present

## 2020-10-22 DIAGNOSIS — K219 Gastro-esophageal reflux disease without esophagitis: Secondary | ICD-10-CM | POA: Diagnosis not present

## 2020-10-22 DIAGNOSIS — M5412 Radiculopathy, cervical region: Secondary | ICD-10-CM

## 2020-10-22 DIAGNOSIS — Z13220 Encounter for screening for lipoid disorders: Secondary | ICD-10-CM

## 2020-10-22 DIAGNOSIS — R739 Hyperglycemia, unspecified: Secondary | ICD-10-CM

## 2020-10-22 DIAGNOSIS — Z0001 Encounter for general adult medical examination with abnormal findings: Secondary | ICD-10-CM

## 2020-10-22 DIAGNOSIS — Z23 Encounter for immunization: Secondary | ICD-10-CM

## 2020-10-22 DIAGNOSIS — E669 Obesity, unspecified: Secondary | ICD-10-CM | POA: Insufficient documentation

## 2020-10-22 LAB — CBC
HCT: 41.7 % (ref 39.0–52.0)
Hemoglobin: 14.1 g/dL (ref 13.0–17.0)
MCHC: 33.7 g/dL (ref 30.0–36.0)
MCV: 86.1 fl (ref 78.0–100.0)
Platelets: 280 10*3/uL (ref 150.0–400.0)
RBC: 4.84 Mil/uL (ref 4.22–5.81)
RDW: 13.2 % (ref 11.5–15.5)
WBC: 6.7 10*3/uL (ref 4.0–10.5)

## 2020-10-22 LAB — COMPREHENSIVE METABOLIC PANEL
ALT: 39 U/L (ref 0–53)
AST: 24 U/L (ref 0–37)
Albumin: 4.4 g/dL (ref 3.5–5.2)
Alkaline Phosphatase: 62 U/L (ref 39–117)
BUN: 15 mg/dL (ref 6–23)
CO2: 28 mEq/L (ref 19–32)
Calcium: 9.9 mg/dL (ref 8.4–10.5)
Chloride: 101 mEq/L (ref 96–112)
Creatinine, Ser: 1.09 mg/dL (ref 0.40–1.50)
GFR: 78.92 mL/min (ref >60.00)
Glucose, Bld: 78 mg/dL (ref 70–99)
Potassium: 4.1 mEq/L (ref 3.5–5.1)
Sodium: 139 mEq/L (ref 135–145)
Total Bilirubin: 0.5 mg/dL (ref 0.2–1.2)
Total Protein: 7.3 g/dL (ref 6.0–8.3)

## 2020-10-22 LAB — LIPID PANEL
Cholesterol: 179 mg/dL (ref 0–200)
HDL: 55.4 mg/dL (ref >39.00)
LDL Cholesterol: 109 mg/dL — ABNORMAL HIGH (ref 0–99)
NonHDL: 124
Total CHOL/HDL Ratio: 3
Triglycerides: 74 mg/dL (ref 0.0–149.0)
VLDL: 14.8 mg/dL (ref 0.0–40.0)

## 2020-10-22 LAB — MAGNESIUM: Magnesium: 2.1 mg/dL (ref 1.5–2.5)

## 2020-10-22 LAB — TSH: TSH: 1.67 u[IU]/mL (ref 0.35–4.50)

## 2020-10-22 LAB — HEMOGLOBIN A1C: Hgb A1c MFr Bld: 5.6 % (ref 4.6–6.5)

## 2020-10-22 MED ORDER — OZEMPIC (0.25 OR 0.5 MG/DOSE) 2 MG/1.5ML ~~LOC~~ SOPN: 0.2500 mg | PEN_INJECTOR | SUBCUTANEOUS | 0 refills | Status: DC

## 2020-10-22 NOTE — Patient Instructions (Signed)
It was very nice to see you today!  Please start the Ozempic.  Send me a message in a few weeks let me know how this is working for you and you can increase the dose as needed.  I will place a referral for you to see a sports medicine doctor for your thumb and neck.  We will give your flu vaccine today.  Please check with your insurance about the shingles vaccine.  We will check blood work today.  I would like to see back in about 3 months for follow-up on Ozempic and weight loss.  I will see you back in year for your next annual checkup.  Take care, Dr [name]  Please try these tips to maintain a healthy lifestyle:   Eat at least 3 REAL meals and 1-2 snacks per day.  Aim for no more than 5 hours between eating.  If you eat breakfast, please do so within one hour of getting up.    Each meal should contain half fruits/vegetables, one quarter protein, and one quarter carbs (no bigger than a computer mouse)   Cut down on sweet beverages. This includes juice, soda, and sweet tea.     Drink at least 1 glass of water with each meal and aim for at least 8 glasses per day   Exercise at least 150 minutes every week.    Preventive Care 31-49 Years Old, Male Preventive care refers to lifestyle choices and visits with your health care provider that can promote health and wellness. This includes:  A yearly physical exam. This is also called an annual wellness visit.  Regular dental and eye exams.  Immunizations.  Screening for certain conditions.  Healthy lifestyle choices, such as: ? Eating a healthy diet. ? Getting regular exercise. ? Not using drugs or products that contain nicotine and tobacco. ? Limiting alcohol use. What can I expect for my preventive care visit? Physical exam Your health care provider will check your:  Height and weight. These may be used to calculate your BMI (body mass index). BMI is a measurement that tells if you are at a healthy  weight.  Heart rate and blood pressure.  Body temperature.  Skin for abnormal spots. Counseling Your health care provider may ask you questions about your:  Past medical problems.  Family's medical history.  Alcohol, tobacco, and drug use.  Emotional well-being.  Home life and relationship well-being.  Sexual activity.  Diet, exercise, and sleep habits.  Work and work Astronomer.  Access to firearms. What immunizations do I need? Vaccines are usually given at various ages, according to a schedule. Your health care provider will recommend vaccines for you based on your age, medical history, and lifestyle or other factors, such as travel or where you work.   What tests do I need? Blood tests  Lipid and cholesterol levels. These may be checked every 5 years, or more often if you are over 67 years old.  Hepatitis C test.  Hepatitis B test. Screening  Lung cancer screening. You may have this screening every year starting at age 68 if you have a 30-pack-year history of smoking and currently smoke or have quit within the past 15 years.  Prostate cancer screening. Recommendations will vary depending on your family history and other risks.  Genital exam to check for testicular cancer or hernias.  Colorectal cancer screening. ? All adults should have this screening starting at age 12 and continuing until age 66. ? Your health care provider  may recommend screening at age 80 if you are at increased risk. ? You will have tests every 1-10 years, depending on your results and the type of screening test.  Diabetes screening. ? This is done by checking your blood sugar (glucose) after you have not eaten for a while (fasting). ? You may have this done every 1-3 years.  STD (sexually transmitted disease) testing, if you are at risk. Follow these instructions at home: Eating and drinking  Eat a diet that includes fresh fruits and vegetables, whole grains, lean protein, and  low-fat dairy products.  Take vitamin and mineral supplements as recommended by your health care provider.  Do not drink alcohol if your health care provider tells you not to drink.  If you drink alcohol: ? Limit how much you have to 0-2 drinks a day. ? Be aware of how much alcohol is in your drink. In the [place] one drink equals one 12 oz bottle of beer (355 mL), one 5 oz glass of wine (148 mL), or one 1 oz glass of hard liquor (44 mL).   Lifestyle  Take daily care of your teeth and gums. Brush your teeth every morning and night with fluoride toothpaste. Floss one time each day.  Stay active. Exercise for at least 30 minutes 5 or more days each week.  Do not use any products that contain nicotine or tobacco, such as cigarettes, e-cigarettes, and chewing tobacco. If you need help quitting, ask your health care provider.  Do not use drugs.  If you are sexually active, practice safe sex. Use a condom or other form of protection to prevent STIs (sexually transmitted infections).  If told by your health care provider, take low-dose aspirin daily starting at age 78.  Find healthy ways to cope with stress, such as: ? Meditation, yoga, or listening to music. ? Journaling. ? Talking to a trusted person. ? Spending time with friends and family. Safety  Always wear your seat belt while driving or riding in a vehicle.  Do not drive: ? If you have been drinking alcohol. Do not ride with someone who has been drinking. ? When you are tired or distracted. ? While texting.  Wear a helmet and other protective equipment during sports activities.  If you have firearms in your house, make sure you follow all gun safety procedures. What's next?  Go to your health care provider once a year for an annual wellness visit.  Ask your health care provider how often you should have your eyes and teeth checked.  Stay up to date on all vaccines. This information is not intended to replace advice  given to you by your health care provider. Make sure you discuss any questions you have with your health care provider. Document Revised: 05/17/2019 Document Reviewed: 08/12/2018 Elsevier Patient Education  2021 [employer].

## 2020-10-22 NOTE — Assessment & Plan Note (Signed)
Discussed treatment options.  We will start Ozempic 0.25 mg weekly.  He will follow-up with me in a few weeks via MyChart.  Will increase dose as tolerated.  Discussed lifestyle modifications.

## 2020-10-22 NOTE — Progress Notes (Signed)
Chief Complaint:  Tristan Merritt is a 50 y.o. male who presents today for his annual comprehensive physical exam.    Assessment/Plan:  New/Acute Problems: Cervical Radiculopathy No red flags.  Place referral to sports medicine.  Chronic Problems Addressed Today: Obesity Discussed treatment options.  We will start Ozempic 0.25 mg weekly.  He will follow-up with me in a few weeks via MyChart.  Will increase dose as tolerated.  Discussed lifestyle modifications.  Glaucoma Continue latanoprost drops per ophthalmology.  Hyperglycemia Check A1c.  We will start Ozempic.  GERD without esophagitis Stable on Nexium 20 mg daily.   Body mass index is 34.8 kg/m. / Obesity  BMI Metric Follow Up - 10/22/20 1019      BMI Metric Follow Up-Please document annually   BMI Metric Follow Up Education provided            Preventative Healthcare: Check Labs.  Flu vaccine given today.  Will place order for [name].  Patient Counseling(The following topics were reviewed and/or handout was given):  -Nutrition: Stressed importance of moderation in sodium/caffeine intake, saturated fat and cholesterol, caloric balance, sufficient intake of fresh fruits, vegetables, and fiber.  -Stressed the importance of regular exercise.   -Substance Abuse: Discussed cessation/primary prevention of tobacco, alcohol, or other drug use; driving or other dangerous activities under the influence; availability of treatment for abuse.   -Injury prevention: Discussed safety belts, safety helmets, smoke detector, smoking near bedding or upholstery.   -Sexuality: Discussed sexually transmitted diseases, partner selection, use of condoms, avoidance of unintended pregnancy and contraceptive alternatives.   -Dental health: Discussed importance of regular tooth brushing, flossing, and dental visits.  -Health maintenance and immunizations reviewed. Please refer to Health maintenance section.  Return to care in 1 year  for next preventative visit.     Subjective:  HPI:  He has no acute complaints today.   He has been having issues with cervical radiculopathy.  Has seen physical therapy which is helped.  Has some pain in the left side of his neck.  Also has some numbness in his thumb.  Occasionally has popping and clicking left thumb as well.  No specific injuries or precipitating events.  He would also like to discuss weight loss.  Lifestyle Diet: Balanced. Plenty of fruits and vegetables. Lean proteins.  Exercise: Very active with work.   Depression screen [facility] 2/9 10/22/2020  Decreased Interest 0  Down, Depressed, Hopeless 0  PHQ - 2 Score 0  Altered sleeping -  Tired, decreased energy -  Change in appetite -  Feeling bad or failure about yourself  -  Trouble concentrating -  Moving slowly or fidgety/restless -  Suicidal thoughts -  PHQ-9 Score -  Difficult doing work/chores -    Health Maintenance Due  Topic Date Due  . COLONOSCOPY (Pts 45-38yr Insurance coverage will need to be confirmed)  Never done  . INFLUENZA VACCINE  04/01/2020  . COVID-19 Vaccine (3 - Booster for Pfizer series) 06/19/2020     ROS: Per HPI, otherwise a complete review of systems was negative.   PMH:  The following were reviewed and entered/updated in epic: Past Medical History:  Diagnosis Date  . GERD (gastroesophageal reflux disease)   . Seasonal allergies    Patient Active Problem List   Diagnosis Date Noted  . Hyperglycemia 10/22/2020  . Glaucoma 10/22/2020  . Obesity 10/22/2020  . Chronic seasonal allergic rhinitis 03/23/2019  . Greater trochanteric bursitis of right hip 10/11/2018  . Pes planus 10/11/2018  .  GERD without esophagitis 08/19/2018  . White coat syndrome without diagnosis of hypertension 08/19/2018   Past Surgical History:  Procedure Laterality Date  . NASAL SINUS SURGERY  2000   Remove cyst    Family History  Problem Relation Age of Onset  . Lymphoma Mother   . Stroke  Father   . Hypertension Father   . Diabetes Father        Borderline  . Multiple myeloma Maternal Grandmother   . Heart disease Maternal Grandfather   . Hypertension Paternal Grandmother   . Stroke Paternal Grandmother   . HIV Brother   . Glaucoma Brother   . Healthy Son   . Healthy Son     Medications- reviewed and updated Current Outpatient Medications  Medication Sig Dispense Refill  . esomeprazole (NEXIUM) 20 MG capsule Take 20 mg by mouth daily at 12 noon.    . latanoprost (XALATAN) 0.005 % ophthalmic solution SMARTSIG:In Eye(s)    . loratadine (CLARITIN) 10 MG tablet Take 10 mg by mouth daily.    . magnesium 30 MG tablet Take 30 mg by mouth 2 (two) times daily.    . Semaglutide,0.25 or 0.5MG/DOS, (OZEMPIC, 0.25 OR 0.5 MG/DOSE,) 2 MG/1.5ML SOPN Inject 0.25 mg into the skin once a week. 1.5 mL 0   No current facility-administered medications for this visit.    Allergies-reviewed and updated Allergies  Allergen Reactions  . Biaxin [Clarithromycin] Nausea And Vomiting  . Sulfa Antibiotics Hives    Social History   Socioeconomic History  . Marital status: Divorced    Spouse name: girlfriend, Freddy Finner  . Number of children: 2  . Years of education: Not on file  . Highest education level: Not on file  Occupational History  . 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 Smoking status: Never Smoker  . Smokeless tobacco: Never Used  Vaping Use  . Vaping Use: Never used  Substance and Sexual Activity  . Alcohol use: Yes    Alcohol/week: 1.0 - 2.0 standard drink    Types: 1 - 2 Cans of beer per week  . Drug use: Never  . Sexual activity: Yes  Other Topics Concern  . Not on file  Social History Narrative  . Not on file   Social Determinants of Health   Financial Resource Strain: Not on file  Food Insecurity: Not on file  Transportation Needs: Not on file  Physical Activity: Not on file  Stress: Not on file  Social  Connections: Not on file        Objective:  Physical Exam: BP 122/81   Pulse 83   Temp 98.4 F (36.9 C) (Temporal)   Ht '6\' 1"'  (1.854 m)   Wt 263 lb 12.8 oz (119.7 kg)   SpO2 96%   BMI 34.80 kg/m   Body mass index is 34.8 kg/m. Wt Readings from Last 3 Encounters:  10/22/20 263 lb 12.8 oz (119.7 kg)  10/11/18 259 lb (117.5 kg)  09/14/18 252 lb 6.4 oz (114.5 kg)   Gen: NAD, resting comfortably HEENT: TMs normal bilaterally. OP clear. No thyromegaly noted.  CV: RRR with no murmurs appreciated Pulm: NWOB, CTAB with no crackles, wheezes, or rhonchi GI: Normal bowel sounds present. Soft, Nontender, Nondistended. MSK: no edema, cyanosis, or clubbing noted.  Tinel's sign negative at left wrist.  Neurovascular intact distally. Skin: warm, dry Neuro: CN2-12 grossly intact. Strength 5/5 in upper and lower extremities. Reflexes symmetric and intact bilaterally.  Psych: Normal affect and thought content     Dajahnae Vondra M. Jerline Pain, MD 10/22/2020 10:19 AM

## 2020-10-22 NOTE — Assessment & Plan Note (Signed)
Stable on Nexium 20 mg daily. 

## 2020-10-22 NOTE — Assessment & Plan Note (Signed)
Check A1c.  We will start Ozempic.

## 2020-10-22 NOTE — Assessment & Plan Note (Signed)
Continue latanoprost drops per ophthalmology.

## 2020-10-23 NOTE — Telephone Encounter (Signed)
PA send today  Waiting for determination  

## 2020-10-23 NOTE — Progress Notes (Signed)
Please inform patient of the following:  Labs are all stable. We are working on his ozempic prior authorization. Do not need to make any changes to his treatment plan at this time. Would like for him to keep working on diet and exercise and we can recheck labs in a year.

## 2020-10-24 NOTE — Telephone Encounter (Signed)
PA This request has received a Unfavorable outcome.

## 2020-10-25 NOTE — Telephone Encounter (Signed)
Please let know patient insurance denied ozempic.  Katina Degree. [name], MD 10/25/2020 10:34 AM

## 2020-10-26 NOTE — Telephone Encounter (Signed)
Patient notified Patient will call insurance for more information

## 2020-10-27 ENCOUNTER — Encounter: Payer: Self-pay | Admitting: Family Medicine

## 2020-10-28 LAB — COLOGUARD

## 2020-11-02 ENCOUNTER — Telehealth: Payer: Self-pay | Admitting: *Deleted

## 2020-11-02 NOTE — Telephone Encounter (Signed)
[name] sample could not be processed, stool exceeds the allowable weight

## 2020-11-03 NOTE — Telephone Encounter (Signed)
Please inform patient and contact cologuard. Do we need to send another kit?  Tristan Merritt. Jerline Pain, MD 11/03/2020 11:07 AM

## 2020-11-05 NOTE — Telephone Encounter (Signed)
Left message to return call to our office at their convenience.  

## 2020-11-09 ENCOUNTER — Ambulatory Visit: Payer: BC Managed Care – PPO | Admitting: Family Medicine

## 2020-11-09 NOTE — Telephone Encounter (Signed)
Pt received sample kit will send another sample to be tested

## 2020-11-10 LAB — COLOGUARD: Cologuard: NEGATIVE

## 2020-11-16 ENCOUNTER — Encounter: Payer: Self-pay | Admitting: Family Medicine

## 2020-11-22 ENCOUNTER — Ambulatory Visit: Payer: BC Managed Care – PPO | Admitting: Family Medicine

## 2021-01-22 ENCOUNTER — Other Ambulatory Visit: Payer: Self-pay

## 2021-01-22 ENCOUNTER — Ambulatory Visit (INDEPENDENT_AMBULATORY_CARE_PROVIDER_SITE_OTHER): Payer: Self-pay | Admitting: Family Medicine

## 2021-01-22 ENCOUNTER — Encounter: Payer: Self-pay | Admitting: Family Medicine

## 2021-01-22 VITALS — BP 115/77 | HR 74 | Temp 98.1°F | Ht 73.0 in | Wt 246.0 lb

## 2021-01-22 DIAGNOSIS — R739 Hyperglycemia, unspecified: Secondary | ICD-10-CM

## 2021-01-22 DIAGNOSIS — M5412 Radiculopathy, cervical region: Secondary | ICD-10-CM | POA: Insufficient documentation

## 2021-01-22 DIAGNOSIS — E669 Obesity, unspecified: Secondary | ICD-10-CM

## 2021-01-22 MED ORDER — CYCLOBENZAPRINE HCL 10 MG PO TABS: 10.0000 mg | ORAL_TABLET | Freq: Three times a day (TID) | ORAL | 0 refills | Status: DC | PRN

## 2021-01-22 NOTE — Patient Instructions (Signed)
It was very nice to see you today!  Keep up the great work with your diet and exercise.  Please work on the exercises for your neck.  Take the Flexeril as needed.  Let me know if your symptoms are not improving.  I will see back next year for your physical.  Come back to see me sooner if needed.  Take care, Dr [name]  PLEASE NOTE:  If you had any lab tests please let us know if you have not heard back within a few days. You may see your results on mychart before we have a chance to review them but we will give you a call once they are reviewed by [place]. If we ordered any referrals today, please let us know if you have not heard from their office within the next week.   Please try these tips to maintain a healthy lifestyle:   Eat at least 3 REAL meals and 1-2 snacks per day.  Aim for no more than 5 hours between eating.  If you eat breakfast, please do so within one hour of getting up.    Each meal should contain half fruits/vegetables, one quarter protein, and one quarter carbs (no bigger than a computer mouse)   Cut down on sweet beverages. This includes juice, soda, and sweet tea.     Drink at least 1 glass of water with each meal and aim for at least 8 glasses per day   Exercise at least 150 minutes every week.   +

## 2021-01-22 NOTE — Assessment & Plan Note (Signed)
No red flags.  He takes over-the-counter NSAIDs as needed which works well.  We will start Flexeril.  Discussed home exercise and handout was given.  He will let me know if not improving and we can refer to PT or sports med.

## 2021-01-22 NOTE — Assessment & Plan Note (Signed)
Last A1c 5.6.  We will recheck A1c with next blood draw.

## 2021-01-22 NOTE — Assessment & Plan Note (Signed)
Insurance did not pay for Ozempic.  He has been working on dietary and activity level changes.  He is down about 20 pounds over the last 3 months.  Congratulated patient.

## 2021-01-22 NOTE — Progress Notes (Signed)
   Tristan Merritt is a 50 y.o. male who presents today for an office visit.  Assessment/Plan:  Chronic Problems Addressed Today: Cervical radiculopathy No red flags.  He takes over-the-counter NSAIDs as needed which works well.  We will start Flexeril.  Discussed home exercise and handout was given.  He will let me know if not improving and we can refer to PT or sports med.  Obesity Insurance did not pay for Ozempic.  He has been working on dietary and activity level changes.  He is down about 20 pounds over the last 3 months.  Congratulated patient.  Hyperglycemia Last A1c 5.6.  We will recheck A1c with next blood draw.     Subjective:  HPI:  See a/p.       Objective:  Physical Exam: BP 115/77   Pulse 74   Temp 98.1 F (36.7 C)   Ht 6\' 1"  (1.854 m)   Wt 246 lb (111.6 kg)   SpO2 97%   BMI 32.46 kg/m   Wt Readings from Last 3 Encounters:  01/22/21 246 lb (111.6 kg)  10/22/20 263 lb 12.8 oz (119.7 kg)  10/11/18 259 lb (117.5 kg)    Gen: No acute distress, resting comfortably CV: Regular rate and rhythm with no murmurs appreciated Pulm: Normal work of breathing, clear to auscultation bilaterally with no crackles, wheezes, or rhonchi MSK: Mild crepitus at neck with active range of motion.  Neurovascular intact distally.  Tinel's sign negative at left wrist.  Spurling negative. Neuro: Grossly normal, moves all extremities Psych: Normal affect and thought content      Janyia Guion M. 12/10/18, MD 01/22/2021 9:10 AM

## 2021-01-23 ENCOUNTER — Other Ambulatory Visit: Payer: Self-pay | Admitting: *Deleted

## 2021-01-23 MED ORDER — CYCLOBENZAPRINE HCL 10 MG PO TABS: 10.0000 mg | ORAL_TABLET | Freq: Three times a day (TID) | ORAL | 0 refills | Status: DC | PRN

## 2021-07-08 ENCOUNTER — Encounter: Payer: Self-pay | Admitting: Family Medicine

## 2021-07-08 NOTE — Telephone Encounter (Signed)
Please advise 

## 2021-07-12 ENCOUNTER — Telehealth: Payer: Self-pay | Admitting: Family Medicine

## 2021-07-12 NOTE — Telephone Encounter (Signed)
Patient would like to know if Dr. Carmelia Roller is willing to take him on as a patient since he also see his fiance Marlowe Aschoff. He was informed that Carmelia Roller was not currently accepting new patients but a note would be sent. He was also informed that if Dr. Carmelia Roller agreed, a TOC message would be sent to his old provider to approve the transfer. Please advice.

## 2021-07-12 NOTE — Telephone Encounter (Signed)
OK 

## 2021-07-12 NOTE — Telephone Encounter (Signed)
Ok with me.   Katina Degree. [name], MD 07/12/2021 1:35 PM

## 2021-07-15 NOTE — Telephone Encounter (Signed)
LVM for patient to call back and schedule TOC appointment with Dr. Carmelia Roller.

## 2021-08-14 ENCOUNTER — Encounter: Payer: Self-pay | Admitting: Family Medicine

## 2021-08-14 ENCOUNTER — Ambulatory Visit (INDEPENDENT_AMBULATORY_CARE_PROVIDER_SITE_OTHER): Payer: BC Managed Care – PPO | Admitting: Family Medicine

## 2021-08-14 ENCOUNTER — Ambulatory Visit (HOSPITAL_BASED_OUTPATIENT_CLINIC_OR_DEPARTMENT_OTHER)
Admission: RE | Admit: 2021-08-14 | Discharge: 2021-08-14 | Disposition: A | Discharge status: Home / Self Care | Payer: BC Managed Care – PPO | Source: Ambulatory Visit | Attending: Family Medicine | Admitting: Family Medicine

## 2021-08-14 ENCOUNTER — Other Ambulatory Visit: Payer: Self-pay

## 2021-08-14 VITALS — BP 120/82 | HR 88 | Temp 97.8°F | Ht 73.0 in | Wt 243.5 lb

## 2021-08-14 DIAGNOSIS — G8929 Other chronic pain: Secondary | ICD-10-CM | POA: Diagnosis not present

## 2021-08-14 DIAGNOSIS — M542 Cervicalgia: Secondary | ICD-10-CM

## 2021-08-14 DIAGNOSIS — J040 Acute laryngitis: Secondary | ICD-10-CM

## 2021-08-14 MED ORDER — AMOXICILLIN 875 MG PO TABS: 875.0000 mg | ORAL_TABLET | Freq: Two times a day (BID) | ORAL | 0 refills | Status: AC

## 2021-08-14 NOTE — Patient Instructions (Addendum)
Continue to push fluids, practice good hand hygiene, and cover your mouth if you cough.  If you start having fevers, shaking or shortness of breath, seek immediate care.  Consider throat lozenges, salt water gargles and an air humidifier for symptomatic care.   Heat (pad or rice pillow in microwave) over affected area, 10-15 minutes twice daily.   Ice/cold pack over area for 10-15 min twice daily.  Foods that may reduce pain: 1) Ginger 2) Blueberries 3) Salmon 4) Pumpkin seeds 5) dark chocolate 6) turmeric 7) tart cherries 8) virgin olive oil 9) chilli peppers 10) mint 11) red wine  Let us know if you need anything.  Trapezius stretches/exercises Do exercises exactly as told by your health care provider and adjust them as directed. It is normal to feel mild stretching, pulling, tightness, or discomfort as you do these exercises, but you should stop right away if you feel sudden pain or your pain gets worse.   Stretching and range of motion exercises These exercises warm up your muscles and joints and improve the movement and flexibility of your shoulder. These exercises can also help to relieve pain, numbness, and tingling. If you are unable to do any of the following for any reason, do not further attempt to do it.   Exercise A: Flexion, standing     Stand and hold a broomstick, a cane, or a similar object. Place your hands a little more than shoulder-width apart on the object. Your left / right hand should be palm-up, and your other hand should be palm-down. Push the stick to raise your left / right arm out to your side and then over your head. Use your other hand to help move the stick. Stop when you feel a stretch in your shoulder, or when you reach the angle that is recommended by your health care provider. Avoid shrugging your shoulder while you raise your arm. Keep your shoulder blade tucked down toward your spine. Hold for 30 seconds. Slowly return to the starting  position. Repeat 2 times. Complete this exercise 3 times per week.  Exercise B: Abduction, supine     Lie on your back and hold a broomstick, a cane, or a similar object. Place your hands a little more than shoulder-width apart on the object. Your left / right hand should be palm-up, and your other hand should be palm-down. Push the stick to raise your left / right arm out to your side and then over your head. Use your other hand to help move the stick. Stop when you feel a stretch in your shoulder, or when you reach the angle that is recommended by your health care provider. Avoid shrugging your shoulder while you raise your arm. Keep your shoulder blade tucked down toward your spine. Hold for 30 seconds. Slowly return to the starting position. Repeat 2 times. Complete this exercise 3 times per week.  Exercise C: Flexion, active-assisted     Lie on your back. You may bend your knees for comfort. Hold a broomstick, a cane, or a similar object. Place your hands about shoulder-width apart on the object. Your palms should face toward your feet. Raise the stick and move your arms over your head and behind your head, toward the floor. Use your healthy arm to help your left / right arm move farther. Stop when you feel a gentle stretch in your shoulder, or when you reach the angle where your health care provider tells you to stop. Hold for 30 seconds.  Slowly return to the starting position. Repeat 2 times. Complete this exercise 3 times per week.  Exercise D: External rotation and abduction     Stand in a door frame with one of your feet slightly in front of the other. This is called a staggered stance. Choose one of the following positions as told by your health care provider: Place your hands and forearms on the door frame above your head. Place your hands and forearms on the door frame at the height of your head. Place your hands on the door frame at the height of your elbows. Slowly move  your weight onto your front foot until you feel a stretch across your chest and in the front of your shoulders. Keep your head and chest upright and keep your abdominal muscles tight. Hold for 30 seconds. To release the stretch, shift your weight to your back foot. Repeat 2 times. Complete this stretch 3 times per week.  Strengthening exercises These exercises build strength and endurance in your shoulder. Endurance is the ability to use your muscles for a long time, even after your muscles get tired. Exercise E: Scapular depression and adduction  Sit on a stable chair. Support your arms in front of you with pillows, armrests, or a tabletop. Keep your elbows in line with the sides of your body. Gently move your shoulder blades down toward your middle back. Relax the muscles on the tops of your shoulders and in the back of your neck. Hold for 3 seconds. Slowly release the tension and relax your muscles completely before doing this exercise again. Repeat for a total of 10 repetitions. After you have practiced this exercise, try doing the exercise without the arm support. Then, try the exercise while standing instead of sitting. Repeat 2 times. Complete this exercise 3 times per week.  Exercise F: Shoulder abduction, isometric     Stand or sit about 4-6 inches (10-15 cm) from a wall with your left / right side facing the wall. Bend your left / right elbow and gently press your elbow against the wall. Increase the pressure slowly until you are pressing as hard as you can without shrugging your shoulder. Hold for 3 seconds. Slowly release the tension and relax your muscles completely. Repeat for a total of 10 repetitions. Repeat 2 times. Complete this exercise 3 times per week.  Exercise G: Shoulder flexion, isometric     Stand or sit about 4-6 inches (10-15 cm) away from a wall with your left / right side facing the wall. Keep your left / right elbow straight and gently press the top of your  fist against the wall. Increase the pressure slowly until you are pressing as hard as you can without shrugging your shoulder. Hold for 10-15 seconds. Slowly release the tension and relax your muscles completely. Repeat for a total of 10 repetitions. Repeat 2 times. Complete this exercise 3 times per week.  Exercise H: Internal rotation     Sit in a stable chair without armrests, or stand. Secure an exercise band at your left / right side, at elbow height. Place a soft object, such as a folded towel or a small pillow, under your left / right upper arm so your elbow is a few inches (about 8 cm) away from your side. Hold the end of the exercise band so the band stretches. Keeping your elbow pressed against the soft object under your arm, move your forearm across your body toward your abdomen. Keep your body  steady so the movement is only coming from your shoulder. Hold for 3 seconds. Slowly return to the starting position. Repeat for a total of 10 repetitions. Repeat 2 times. Complete this exercise 3 times per week.  Exercise I: External rotation     Sit in a stable chair without armrests, or stand. Secure an exercise band at your left / right side, at elbow height. Place a soft object, such as a folded towel or a small pillow, under your left / right upper arm so your elbow is a few inches (about 8 cm) away from your side. Hold the end of the exercise band so the band stretches. Keeping your elbow pressed against the soft object under your arm, move your forearm out, away from your abdomen. Keep your body steady so the movement is only coming from your shoulder. Hold for 3 seconds. Slowly return to the starting position. Repeat for a total of 10 repetitions. Repeat 2 times. Complete this exercise 3 times per week. Exercise J: Shoulder extension  Sit in a stable chair without armrests, or stand. Secure an exercise band to a stable object in front of you so the band is at shoulder  height. Hold one end of the exercise band in each hand. Your palms should face each other. Straighten your elbows and lift your hands up to shoulder height. Step back, away from the secured end of the exercise band, until the band stretches. Squeeze your shoulder blades together and pull your hands down to the sides of your thighs. Stop when your hands are straight down by your sides. Do not let your hands go behind your body. Hold for 3 seconds. Slowly return to the starting position. Repeat for a total of 10 repetitions. Repeat 2 times. Complete this exercise 3 times per week.  Exercise K: Shoulder extension, prone     Lie on your abdomen on a firm surface so your left / right arm hangs over the edge. Hold a 5 lb weight in your hand so your palm faces in toward your body. Your arm should be straight. Squeeze your shoulder blade down toward the middle of your back. Slowly raise your arm behind you, up to the height of the surface that you are lying on. Keep your arm straight. Hold for 3 seconds. Slowly return to the starting position and relax your muscles. Repeat for a total of 10 repetitions. Repeat 2 times. Complete this exercise 3 times per week.   Exercise L: Horizontal abduction, prone  Lie on your abdomen on a firm surface so your left / right arm hangs over the edge. Hold a 5 lb weight in your hand so your palm faces toward your feet. Your arm should be straight. Squeeze your shoulder blade down toward the middle of your back. Bend your elbow so your hand moves up, until your elbow is bent to an "L" shape (90 degrees). With your elbow bent, slowly move your forearm forward and up. Raise your hand up to the height of the surface that you are lying on. Your upper arm should not move, and your elbow should stay bent. At the top of the movement, your palm should face the floor. Hold for 3 seconds. Slowly return to the starting position and relax your muscles. Repeat for a total of 10  repetitions. Repeat 2 times. Complete this exercise 3 times per week.  Exercise M: Horizontal abduction, standing  Sit on a stable chair, or stand. Secure an exercise band to  a stable object in front of you so the band is at shoulder height. Hold one end of the exercise band in each hand. Straighten your elbows and lift your hands straight in front of you, up to shoulder height. Your palms should face down, toward the floor. Step back, away from the secured end of the exercise band, until the band stretches. Move your arms out to your sides, and keep your arms straight. Hold for 3 seconds. Slowly return to the starting position. Repeat for a total of 10 repetitions. Repeat 2 times. Complete this exercise 3 times per week.  Exercise N: Scapular retraction and elevation  Sit on a stable chair, or stand. Secure an exercise band to a stable object in front of you so the band is at shoulder height. Hold one end of the exercise band in each hand. Your palms should face each other. Sit in a stable chair without armrests, or stand. Step back, away from the secured end of the exercise band, until the band stretches. Squeeze your shoulder blades together and lift your hands over your head. Keep your elbows straight. Hold for 3 seconds. Slowly return to the starting position. Repeat for a total of 10 repetitions. Repeat 2 times. Complete this exercise 3 times per week.  This information is not intended to replace advice given to you by your health care provider. Make sure you discuss any questions you have with your health care provider. Document Released: 08/18/2005 Document Revised: 04/24/2016 Document Reviewed: 07/05/2015 Elsevier Interactive Patient Education  2017 [employer].

## 2021-08-14 NOTE — Progress Notes (Signed)
Chief Complaint  Patient presents with   Transfer of care    Chest cold Cough     Tristan Merritt here for URI complaints.  Duration: 5 days  Associated symptoms: rhinorrhea, ST leading into loss of voice Denies: sinus congestion, sinus pain, itchy watery eyes, ear pain, ear drainage,  wheezing, shortness of breath, myalgia, and fevers Treatment to date: Delsym, Mucinex Sick contacts: Yes; fiancee  Neck/arm- Over past 18 mo, pt has been dealing w numbness in L thumb and pain in neck/shoulder region. He went to PT w some improvement.  He had imaging and a referral office but was told he may need surgery and left the office after that.  No weakness.  Past Medical History:  Diagnosis Date   GERD (gastroesophageal reflux disease)    Seasonal allergies     Objective BP 120/82    Pulse 88    Temp 97.8 F (36.6 C) (Oral)    Ht 6\' 1"  (1.854 m)    Wt 243 lb 8 oz (110.5 kg)    SpO2 96%    BMI 32.13 kg/m  General: Awake, alert, appears stated age HEENT: AT, [place], ears patent b/l and TM's neg, nares patent w/o discharge, pharynx pink and without exudates, MMM Neck: No masses or asymmetry Heart: RRR Lungs: CTAB, no accessory muscle use Psych: Age appropriate judgment and insight, normal mood and affect  Laryngitis - Plan: amoxicillin (AMOXIL) 875 MG tablet  Chronic neck pain - Plan: DG Cervical Spine Complete  Probably viral.  Give it a few more days and if no improvement or spiking a fever, will take amoxicillin for 5 days.  Continue to push fluids, practice good hand hygiene, cover mouth when coughing. Chronic issue, uncontrolled.  Check x-ray of the cervical spine.  Further management pending those results.  I did give him some stretches and exercises as well. Pt voiced understanding and agreement to the plan.  [place], DO 08/14/21 12:30 PM

## 2022-05-06 ENCOUNTER — Encounter: Payer: Self-pay | Admitting: Family Medicine

## 2022-05-06 ENCOUNTER — Ambulatory Visit: Payer: BC Managed Care – PPO | Admitting: Family Medicine

## 2022-05-06 VITALS — BP 112/70 | HR 71 | Temp 98.0°F | Ht 73.0 in | Wt 253.1 lb

## 2022-05-06 DIAGNOSIS — J069 Acute upper respiratory infection, unspecified: Secondary | ICD-10-CM | POA: Diagnosis not present

## 2022-05-06 NOTE — Patient Instructions (Addendum)
Continue to push fluids, practice good hand hygiene, and cover your mouth if you cough.  If you start having fevers, shaking or shortness of breath, seek immediate care.  OK to take Tylenol 1000 mg (2 extra strength tabs) or 975 mg (3 regular strength tabs) every 6 hours as needed.  Consider Flonase 2-3 days prior to flights.   Let us know if you need anything.

## 2022-05-06 NOTE — Progress Notes (Signed)
Chief Complaint  Patient presents with   Ear Drainage    After flying last week. Covid test negative Congestion     Tristan Merritt here for URI complaints.  Duration: 1 week  Associated symptoms: subj fevers, sinus headache, ear pain, ear drainage, and shortness of breath Denies: sinus congestion, sinus pain, itchy watery eyes, sore throat, wheezing, shortness of breath, myalgia, and N/V Treatment to date: Motrin, saline spray Sick contacts: No Tested neg for covid.   Past Medical History:  Diagnosis Date   GERD (gastroesophageal reflux disease)    Seasonal allergies     Objective BP 112/70   Pulse 71   Temp 98 F (36.7 C) (Oral)   Ht 6\' 1"  (1.854 m)   Wt 253 lb 2 oz (114.8 kg)   SpO2 98%   BMI 33.40 kg/m  General: Awake, alert, appears stated age HEENT: AT, [place], ears patent b/l and TM's neg, nares patent w/o discharge, pharynx pink and without exudates, MMM Neck: No masses or asymmetry Heart: RRR Lungs: CTAB, no accessory muscle use Psych: Age appropriate judgment and insight, normal mood and affect  Upper respiratory tract infection, unspecified type  Probably passed. He will use Flonase in future if he has ear issues. Continue to push fluids, practice good hand hygiene, cover mouth if coughing. F/u prn. If starting to experience fevers, shaking, or shortness of breath, seek immediate care. Pt voiced understanding and agreement to the plan.  [place], DO 05/06/22 3:58 PM

## 2022-09-12 ENCOUNTER — Ambulatory Visit (INDEPENDENT_AMBULATORY_CARE_PROVIDER_SITE_OTHER): Payer: BC Managed Care – PPO | Admitting: Family Medicine

## 2022-09-12 ENCOUNTER — Encounter: Payer: Self-pay | Admitting: Family Medicine

## 2022-09-12 VITALS — BP 138/80 | HR 102 | Temp 97.7°F | Resp 16 | Ht 73.0 in | Wt 258.6 lb

## 2022-09-12 DIAGNOSIS — Z23 Encounter for immunization: Secondary | ICD-10-CM | POA: Diagnosis not present

## 2022-09-12 DIAGNOSIS — Z6834 Body mass index (BMI) 34.0-34.9, adult: Secondary | ICD-10-CM | POA: Diagnosis not present

## 2022-09-12 DIAGNOSIS — Z0001 Encounter for general adult medical examination with abnormal findings: Secondary | ICD-10-CM

## 2022-09-12 DIAGNOSIS — E669 Obesity, unspecified: Secondary | ICD-10-CM

## 2022-09-12 DIAGNOSIS — Z125 Encounter for screening for malignant neoplasm of prostate: Secondary | ICD-10-CM | POA: Diagnosis not present

## 2022-09-12 DIAGNOSIS — Z0000 Encounter for general adult medical examination without abnormal findings: Secondary | ICD-10-CM

## 2022-09-12 MED ORDER — SEMAGLUTIDE-WEIGHT MANAGEMENT 1.7 MG/0.75ML ~~LOC~~ SOAJ: 1.7000 mg | SUBCUTANEOUS | 0 refills | Status: DC

## 2022-09-12 MED ORDER — SEMAGLUTIDE-WEIGHT MANAGEMENT 2.4 MG/0.75ML ~~LOC~~ SOAJ: 2.4000 mg | SUBCUTANEOUS | 0 refills | Status: DC

## 2022-09-12 MED ORDER — SEMAGLUTIDE-WEIGHT MANAGEMENT 1 MG/0.5ML ~~LOC~~ SOAJ: 1.0000 mg | SUBCUTANEOUS | 0 refills | Status: DC

## 2022-09-12 MED ORDER — SEMAGLUTIDE-WEIGHT MANAGEMENT 0.5 MG/0.5ML ~~LOC~~ SOAJ: 0.5000 mg | SUBCUTANEOUS | 0 refills | Status: DC

## 2022-09-12 MED ORDER — SEMAGLUTIDE-WEIGHT MANAGEMENT 0.25 MG/0.5ML ~~LOC~~ SOAJ: 0.2500 mg | SUBCUTANEOUS | 0 refills | Status: DC

## 2022-09-12 NOTE — Progress Notes (Signed)
Chief Complaint  Patient presents with   Annual Exam    Annual Exam    Well Male Tristan Merritt is here for a complete physical.   His last physical was >1 year ago.  Current diet: in general, a "healthy" diet.  Current exercise: walking Weight trend: stable Fatigue out of ordinary? No. Seat belt? Yes.   Advanced directive? No  Health maintenance Shingrix- No CCS- Yes Tetanus- Yes HIV- Yes Hep C- Yes  Obesity Hx of obesity. Has tried various diets including fasting without significant improvements. Diet/exercise as above.    Past Medical History:  Diagnosis Date   GERD (gastroesophageal reflux disease)    Seasonal allergies       Past Surgical History:  Procedure Laterality Date   NASAL SINUS SURGERY  2000   Remove cyst    Medications  Current Outpatient Medications on File Prior to Visit  Medication Sig Dispense Refill   esomeprazole (NEXIUM) 20 MG capsule Take 20 mg by mouth daily at 12 noon.     latanoprost (XALATAN) 0.005 % ophthalmic solution SMARTSIG:In Eye(s)     loratadine (CLARITIN) 10 MG tablet Take 10 mg by mouth daily.     magnesium 30 MG tablet Take 30 mg by mouth 2 (two) times daily.     No current facility-administered medications on file prior to visit.     Allergies Allergies  Allergen Reactions   Biaxin [Clarithromycin] Nausea And Vomiting   Sulfa Antibiotics Hives    Family History Family History  Problem Relation Age of Onset   Lymphoma Mother    Stroke Father    Hypertension Father    Diabetes Father        Borderline   Multiple myeloma Maternal Grandmother    Heart disease Maternal Grandfather    Hypertension Paternal Grandmother    Stroke Paternal 10    HIV Brother    Glaucoma Brother    Healthy Son    Healthy Son     Review of Systems: Constitutional:  no fevers Eye:  no recent significant change in vision Ear/Nose/Mouth/Throat:  Ears:  no hearing loss Nose/Mouth/Throat:  no complaints of nasal  congestion, no sore throat Cardiovascular:  no chest pain Respiratory:  no shortness of breath Gastrointestinal:  no change in bowel habits GU:  Male: negative for dysuria, frequency Musculoskeletal/Extremities:  no joint pain Integumentary (Skin/Breast):  no abnormal skin lesions reported Neurologic:  no headaches Endocrine: No unexpected weight changes Hematologic/Lymphatic:  no abnormal bleeding  Exam BP 138/80 (BP Location: Right Arm, Patient Position: Sitting, Cuff Size: Normal)   Pulse (!) 102   Temp 97.7 F (36.5 C) (Oral)   Resp 16   Ht 6\' 1"  (1.854 m)   Wt 258 lb 9.6 oz (117.3 kg)   SpO2 97%   BMI 34.12 kg/m  General:  well developed, well nourished, in no apparent distress Skin:  no significant moles, warts, or growths Head:  no masses, lesions, or tenderness Eyes:  pupils equal and round, sclera anicteric without injection Ears:  canals without lesions, TMs shiny without retraction, no obvious effusion, no erythema Nose:  nares patent, mucosa normal Throat/Pharynx:  lips and gingiva without lesion; tongue and uvula midline; non-inflamed pharynx; no exudates or postnasal drainage Neck: neck supple without adenopathy, thyromegaly, or masses Cardiac: RRR, no bruits, no LE edema Lungs:  clear to auscultation, breath sounds equal bilaterally, no respiratory distress Abdomen: BS+, soft, non-tender, non-distended, no masses or organomegaly noted Rectal: Deferred Musculoskeletal:  symmetrical muscle groups  noted without atrophy or deformity Neuro:  gait normal; deep tendon reflexes normal and symmetric Psych: well oriented with normal range of affect and appropriate judgment/insight  Assessment and Plan  Well adult exam - Plan: CBC, Comprehensive metabolic panel, Lipid panel  Class 1 obesity with body mass index (BMI) of 34.0 to 34.9 in adult, unspecified obesity type, unspecified whether serious comorbidity present - Plan: Amb Ref to Medical Weight Management,  Semaglutide-Weight Management 0.25 MG/0.5ML SOAJ, Semaglutide-Weight Management 0.5 MG/0.5ML SOAJ, Semaglutide-Weight Management 1 MG/0.5ML SOAJ, Semaglutide-Weight Management 1.7 MG/0.75ML SOAJ, Semaglutide-Weight Management 2.4 MG/0.75ML SOAJ  Screening for prostate cancer - Plan: PSA  Well 52 y.o. male. Counseled on diet and exercise. Counseled on risks and benefits of prostate cancer screening with PSA. The patient agrees to undergo testing. Obesity: Chronic,, uncontrolled. Failed numerous types of diets. Refer [facility]. Start Wgovy weekly until he has specialty appt.  Immunizations, labs, and further orders as above. Flu shot today. Shingrix rec'd.  Follow up in 1 year. The patient voiced understanding and agreement to the plan.  [place], DO 09/12/22 2:46 PM

## 2022-09-12 NOTE — Patient Instructions (Addendum)
Give [place] 2-3 business days to get the results of your labs back.   Keep the diet clean and stay active.  Please get me a copy of your advanced directive form at your convenience.   If you do not hear anything about your referral in the next 1-2 weeks, call our office and ask for an update.  Let me know if there are cost issues with the medication.   The Shingrix vaccine (for shingles) is a 2 shot series spaced 2-6 months apart. It can make people feel low energy, achy and almost like they have the flu for 48 hours after injection. 1/5 people can have nausea and/or vomiting. Please plan accordingly when deciding on when to get this shot. Call our office for a nurse visit appointment to get this. The second shot of the series is less severe regarding the side effects, but it still lasts 48 hours.   Let us know if you need anything.

## 2022-09-12 NOTE — Addendum Note (Signed)
Addended by: Laure Kidney on: 09/12/2022 02:51 PM   Modules accepted: Orders

## 2022-09-15 ENCOUNTER — Encounter: Payer: Self-pay | Admitting: Family Medicine

## 2022-09-15 ENCOUNTER — Encounter (INDEPENDENT_AMBULATORY_CARE_PROVIDER_SITE_OTHER): Payer: Self-pay

## 2022-09-15 ENCOUNTER — Other Ambulatory Visit: Payer: Self-pay | Admitting: Family Medicine

## 2022-09-15 DIAGNOSIS — R739 Hyperglycemia, unspecified: Secondary | ICD-10-CM

## 2022-09-16 LAB — COMPREHENSIVE METABOLIC PANEL
AG Ratio: 1.7 (calc) (ref 1.0–2.5)
ALT: 19 U/L (ref 9–46)
AST: 14 U/L (ref 10–35)
Albumin: 4.7 g/dL (ref 3.6–5.1)
Alkaline phosphatase (APISO): 64 U/L (ref 35–144)
BUN: 23 mg/dL (ref 7–25)
CO2: 24 mmol/L (ref 20–32)
Calcium: 9.9 mg/dL (ref 8.6–10.3)
Chloride: 104 mmol/L (ref 98–110)
Creat: 1.1 mg/dL (ref 0.70–1.30)
Globulin: 2.7 g/dL (calc) (ref 1.9–3.7)
Glucose, Bld: 118 mg/dL — ABNORMAL HIGH (ref 65–99)
Potassium: 4.9 mmol/L (ref 3.5–5.3)
Sodium: 143 mmol/L (ref 135–146)
Total Bilirubin: 0.4 mg/dL (ref 0.2–1.2)
Total Protein: 7.4 g/dL (ref 6.1–8.1)

## 2022-09-16 LAB — PSA: PSA: 0.59 ng/mL (ref <=4.00)

## 2022-09-16 LAB — CBC
HCT: 44.3 % (ref 38.5–50.0)
Hemoglobin: 15.2 g/dL (ref 13.2–17.1)
MCH: 29.4 pg (ref 27.0–33.0)
MCHC: 34.3 g/dL (ref 32.0–36.0)
MCV: 85.7 fL (ref 80.0–100.0)
MPV: 10.3 fL (ref 7.5–12.5)
Platelets: 298 10*3/uL (ref 140–400)
RBC: 5.17 10*6/uL (ref 4.20–5.80)
RDW: 12.4 % (ref 11.0–15.0)
WBC: 7.9 10*3/uL (ref 3.8–10.8)

## 2022-09-16 LAB — LIPID PANEL
Cholesterol: 194 mg/dL (ref <200)
HDL: 55 mg/dL (ref >=40)
LDL Cholesterol (Calc): 110 mg/dL (calc) — ABNORMAL HIGH
Non-HDL Cholesterol (Calc): 139 mg/dL (calc) — ABNORMAL HIGH (ref <130)
Total CHOL/HDL Ratio: 3.5 (calc) (ref <5.0)
Triglycerides: 175 mg/dL — ABNORMAL HIGH (ref <150)

## 2022-09-16 LAB — HEMOGLOBIN A1C W/OUT EAG: Hgb A1c MFr Bld: 5.7 % of total Hgb — ABNORMAL HIGH (ref <5.7)

## 2022-09-17 ENCOUNTER — Other Ambulatory Visit: Payer: Self-pay | Admitting: Family Medicine

## 2022-09-17 MED ORDER — ZEPBOUND 2.5 MG/0.5ML ~~LOC~~ SOAJ: 2.5000 mg | SUBCUTANEOUS | 0 refills | Status: AC

## 2022-09-17 MED ORDER — ZEPBOUND 5 MG/0.5ML ~~LOC~~ SOAJ: 5.0000 mg | SUBCUTANEOUS | 0 refills | Status: AC

## 2022-09-23 ENCOUNTER — Encounter: Payer: Self-pay | Admitting: Family Medicine

## 2022-10-24 IMAGING — DX DG CERVICAL SPINE COMPLETE 4+V
5 series · 5 of 5 positions shown · non-contrast
Comparison: None.

CLINICAL DATA: Neck pain

EXAM:
CERVICAL SPINE - COMPLETE 4+ VIEW

[c-spine ap]
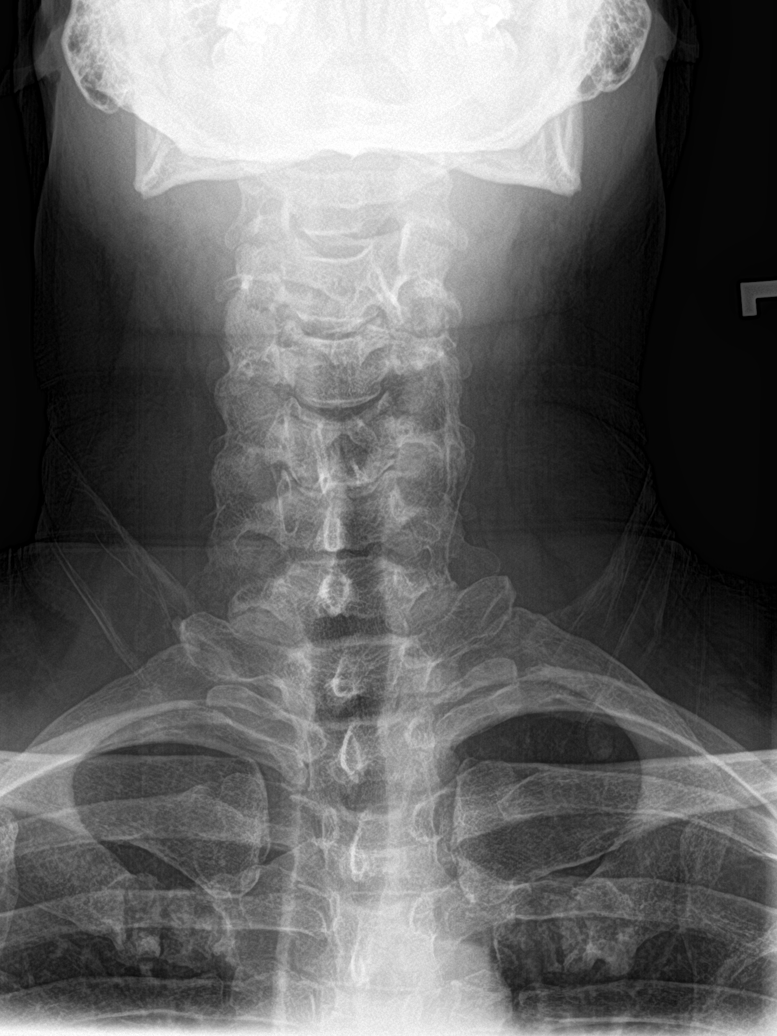

[c-spine obl (1 of 2)]
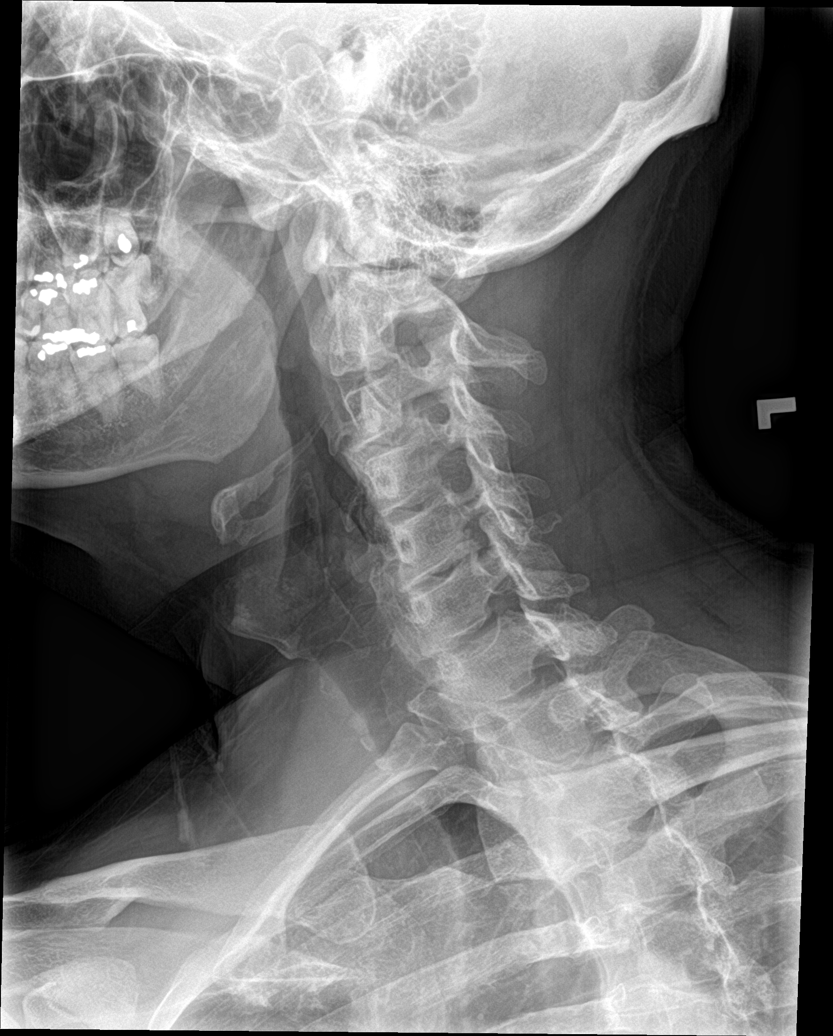

[c-spine obl (2 of 2)]
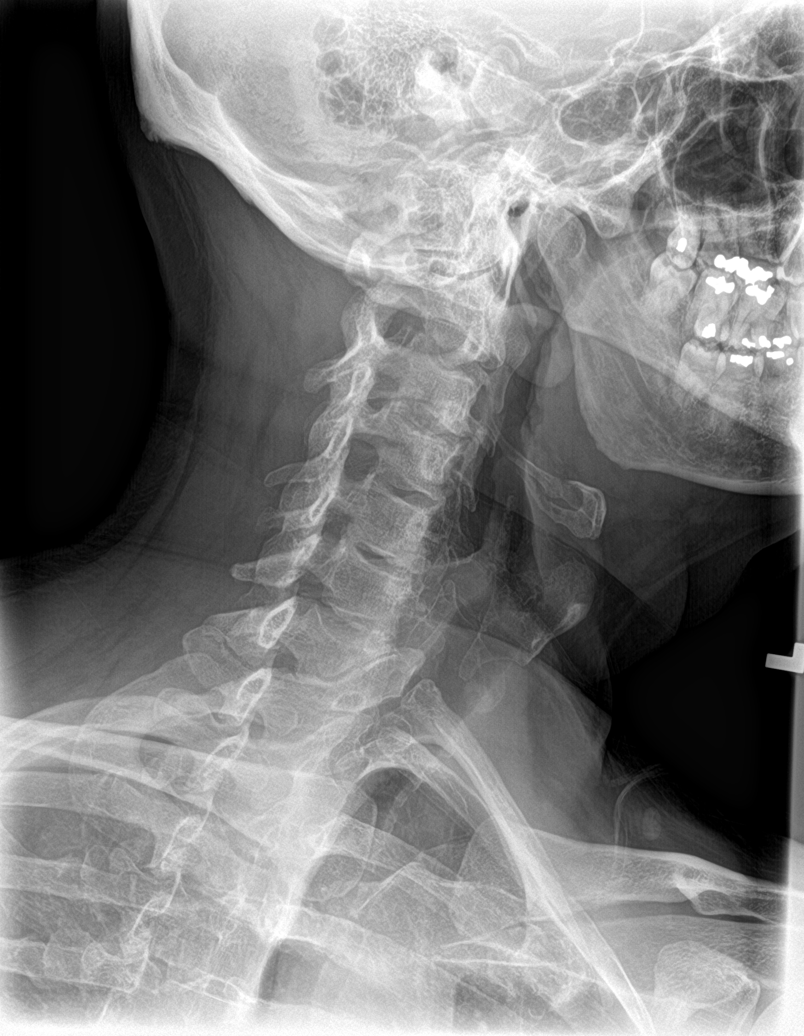

[c-spine lat]
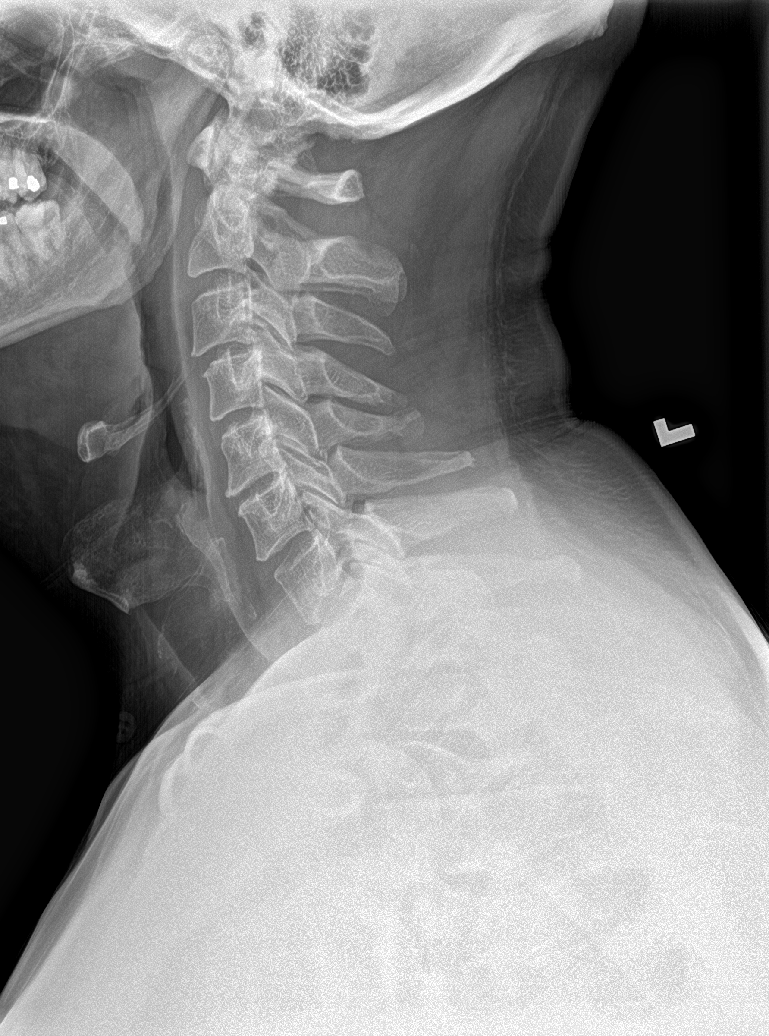

[c-spine open mouth]
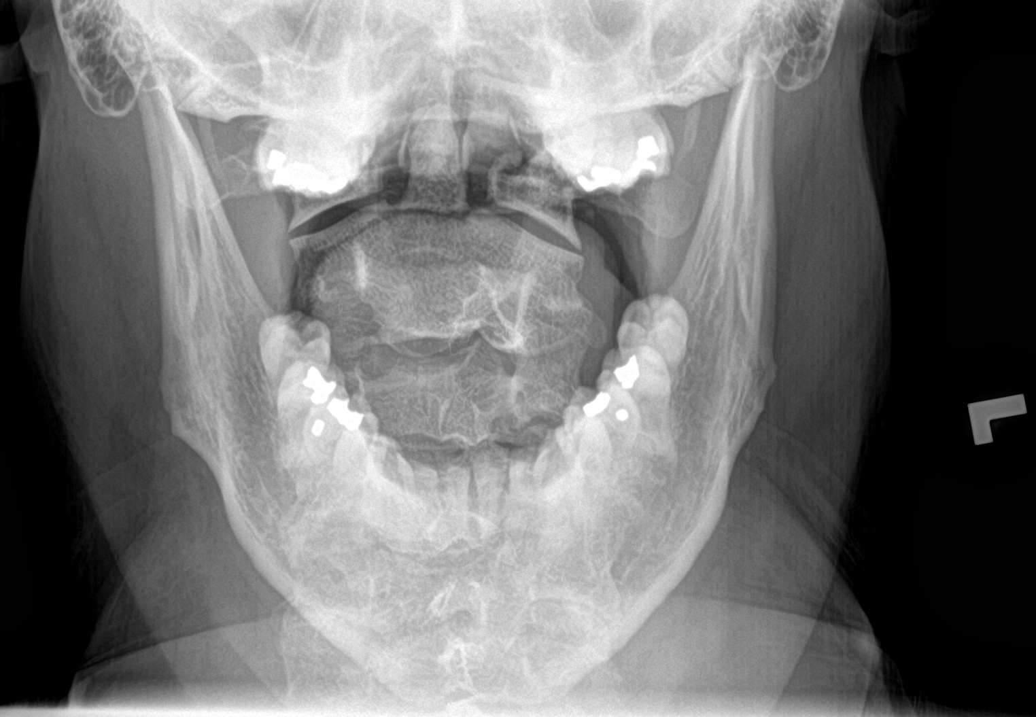

[5 of 5 positions shown; findings below may reference images not displayed]

FINDINGS: Dens and lateral masses are within normal limits. Vertebral body
heights are maintained. Mild disc space narrowing at C3-C4 and
C5-C6. Bilateral foraminal narrowing at those levels.
IMPRESSION: Degenerative changes at C3-C4 and C5-C6

## 2023-01-15 ENCOUNTER — Ambulatory Visit (INDEPENDENT_AMBULATORY_CARE_PROVIDER_SITE_OTHER): Payer: BC Managed Care – PPO | Admitting: Internal Medicine

## 2023-01-15 VITALS — BP 113/76 | HR 89 | Temp 98.2°F | Ht 73.0 in | Wt 250.0 lb

## 2023-01-15 DIAGNOSIS — R7309 Other abnormal glucose: Secondary | ICD-10-CM | POA: Diagnosis not present

## 2023-01-15 DIAGNOSIS — E668 Other obesity: Secondary | ICD-10-CM | POA: Diagnosis not present

## 2023-01-15 DIAGNOSIS — Z6832 Body mass index (BMI) 32.0-32.9, adult: Secondary | ICD-10-CM | POA: Diagnosis not present

## 2023-01-15 DIAGNOSIS — Z0289 Encounter for other administrative examinations: Secondary | ICD-10-CM

## 2023-01-15 DIAGNOSIS — E669 Obesity, unspecified: Secondary | ICD-10-CM

## 2023-01-15 NOTE — Assessment & Plan Note (Signed)
We reviewed weight, biometrics, associated medical conditions and contributing factors with patient. She would benefit from weight loss therapy via a modified calorie, low-carb, high-protein nutritional plan tailored to their REE (resting energy expenditure) which will be determined by indirect calorimetry.  We will also assess for cardiometabolic risk and nutritional derangements via fasting serologies at her next appointment. 

## 2023-01-15 NOTE — Progress Notes (Signed)
Office: [phone]  /  Fax: [phone]   Initial Visit  Tristan Merritt was seen in clinic today to evaluate for obesity. He is interested in losing weight to improve overall health and reduce the risk of weight related complications. He presents today to review program treatment options, initial physical assessment, and evaluation.     He was referred by: Friend or Family  When asked what else they would like to accomplish? He states: Improve energy levels and physical activity, Improve quality of life, and Improve self-confidence  Weight history: was very fit growing up, went to [employer]. Decreased [place]. Travels a lot  When asked how has your weight affected you? He states: Has affected self-esteem, Having fatigue, and Having poor endurance  Some associated conditions: None  Contributing factors: Family history  Weight promoting medications identified: None  Current nutrition plan: Portion control / smart choices  Current or previous pharmacotherapy: None  Response to medication: Never tried medications   Past medical history includes:   Past Medical History:  Diagnosis Date   GERD (gastroesophageal reflux disease)    Seasonal allergies      Objective:   BP 113/76   Pulse 89   Temp 98.2 F (36.8 C)   Ht 6\' 1"  (1.854 m)   Wt 250 lb (113.4 kg)   SpO2 98%   BMI 32.98 kg/m  He was weighed on the bioimpedance scale: Body mass index is 32.98 kg/m.  Peak Weight:275 lbs , Body Fat%:31.6, Visceral Fat Rating:17, Weight trend over the last 12 months: Decreasing  General:  Alert, oriented and cooperative. Patient is in no acute distress.  Respiratory: Normal respiratory effort, no problems with respiration noted   Gait: able to ambulate independently  Mental Status: Normal mood and affect. Normal behavior. Normal judgment and thought content.   DIAGNOSTIC DATA REVIEWED:  BMET    Component Value Date/Time   NA 143 09/12/2022 1457   NA 141 11/08/2016  0000   K 4.9 09/12/2022 1457   CL 104 09/12/2022 1457   CO2 24 09/12/2022 1457   GLUCOSE 118 (H) 09/12/2022 1457   BUN 23 09/12/2022 1457   BUN 10 11/08/2016 0000   CREATININE 1.10 09/12/2022 1457   CALCIUM 9.9 09/12/2022 1457   Lab Results  Component Value Date   HGBA1C 5.7 (H) 09/12/2022   HGBA1C 5.1 11/08/2016   No results found for: "INSULIN" CBC    Component Value Date/Time   WBC 7.9 09/12/2022 1457   RBC 5.17 09/12/2022 1457   HGB 15.2 09/12/2022 1457   HCT 44.3 09/12/2022 1457   PLT 298 09/12/2022 1457   MCV 85.7 09/12/2022 1457   MCH 29.4 09/12/2022 1457   MCHC 34.3 09/12/2022 1457   RDW 12.4 09/12/2022 1457   Iron/TIBC/Ferritin/ %Sat No results found for: "IRON", "TIBC", "FERRITIN", "IRONPCTSAT" Lipid Panel     Component Value Date/Time   CHOL 194 09/12/2022 1457   TRIG 175 (H) 09/12/2022 1457   HDL 55 09/12/2022 1457   CHOLHDL 3.5 09/12/2022 1457   VLDL 14.8 10/22/2020 1023   LDLCALC 110 (H) 09/12/2022 1457   Hepatic Function Panel     Component Value Date/Time   PROT 7.4 09/12/2022 1457   ALBUMIN 4.4 10/22/2020 1023   AST 14 09/12/2022 1457   ALT 19 09/12/2022 1457   ALKPHOS 62 10/22/2020 1023   BILITOT 0.4 09/12/2022 1457      Component Value Date/Time   TSH 1.67 10/22/2020 1023     Assessment and Plan:  Class 1 obesity without serious comorbidity with body mass index (BMI) of 32.0 to 32.9 in adult, unspecified obesity type Assessment & Plan: We reviewed weight, biometrics, associated medical conditions and contributing factors with patient. She would benefit from weight loss therapy via a modified calorie, low-carb, high-protein nutritional plan tailored to their REE (resting energy expenditure) which will be determined by indirect calorimetry.  We will also assess for cardiometabolic risk and nutritional derangements via fasting serologies at her next appointment.   Elevated hemoglobin A1c Assessment & Plan: His most recent A1c was 5.7.   His blood sugar was not fasting.  We would like to assess his risk of insulin resistance, prediabetes and diabetes by checking a fasting blood glucose, insulin levels and hemoglobin A1c with intake of labs.  Losing 10% of body weight may improve condition.  He has been working on reducing and eliminating simple and added sugars in his diet.         Obesity Treatment / Action Plan:  Patient will work on garnering support from family and friends to begin weight loss journey. Will work on eliminating or reducing the presence of highly palatable, calorie dense foods in the home. Will complete provided nutritional and psychosocial assessment questionnaire before the next appointment. Will be scheduled for indirect calorimetry to determine resting energy expenditure in a fasting state.  This will allow [place] to create a reduced calorie, high-protein meal plan to promote loss of fat mass while preserving muscle mass. Counseled on the health benefits of losing 5%-15% of total body weight. Was counseled on nutritional approaches to weight loss and benefits of reducing processed foods and consuming plant-based foods and high quality protein as part of nutritional weight management. Was counseled on pharmacotherapy and role as an adjunct in weight management.   Obesity Education Performed Today:  He was weighed on the bioimpedance scale and results were discussed and documented in the synopsis.  We discussed obesity as a disease and the importance of a more detailed evaluation of all the factors contributing to the disease.  We discussed the importance of long term lifestyle changes which include nutrition, exercise and behavioral modifications as well as the importance of customizing this to his specific health and social needs.  We discussed the benefits of reaching a healthier weight to alleviate the symptoms of existing conditions and reduce the risks of the biomechanical, metabolic and psychological  effects of obesity.  Yakir Moffit appears to be in the action stage of change and states they are ready to start intensive lifestyle modifications and behavioral modifications.  30 minutes was spent today on this visit including the above counseling, pre-visit chart review, and post-visit documentation.  Reviewed by clinician on day of visit: allergies, medications, problem list, medical history, surgical history, family history, social history, and previous encounter notes pertinent to obesity diagnosis.   Worthy Rancher, MD

## 2023-01-15 NOTE — Assessment & Plan Note (Signed)
His most recent A1c was 5.7.  His blood sugar was not fasting.  We would like to assess his risk of insulin resistance, prediabetes and diabetes by checking a fasting blood glucose, insulin levels and hemoglobin A1c with intake of labs.  Losing 10% of body weight may improve condition.  He has been working on reducing and eliminating simple and added sugars in his diet.

## 2023-02-11 ENCOUNTER — Ambulatory Visit (INDEPENDENT_AMBULATORY_CARE_PROVIDER_SITE_OTHER): Payer: BC Managed Care – PPO | Admitting: Internal Medicine

## 2023-02-11 ENCOUNTER — Encounter (INDEPENDENT_AMBULATORY_CARE_PROVIDER_SITE_OTHER): Payer: Self-pay

## 2023-02-18 ENCOUNTER — Encounter (INDEPENDENT_AMBULATORY_CARE_PROVIDER_SITE_OTHER): Payer: Self-pay

## 2023-02-24 ENCOUNTER — Ambulatory Visit (INDEPENDENT_AMBULATORY_CARE_PROVIDER_SITE_OTHER): Payer: BC Managed Care – PPO | Admitting: Internal Medicine

## 2023-02-25 ENCOUNTER — Ambulatory Visit (INDEPENDENT_AMBULATORY_CARE_PROVIDER_SITE_OTHER): Payer: BC Managed Care – PPO | Admitting: Internal Medicine

## 2023-03-10 ENCOUNTER — Ambulatory Visit (INDEPENDENT_AMBULATORY_CARE_PROVIDER_SITE_OTHER): Payer: BC Managed Care – PPO | Admitting: Internal Medicine

## 2023-03-10 ENCOUNTER — Encounter (INDEPENDENT_AMBULATORY_CARE_PROVIDER_SITE_OTHER): Payer: Self-pay | Admitting: Internal Medicine

## 2023-03-10 VITALS — BP 113/83 | HR 74 | Temp 98.3°F | Ht 73.0 in | Wt 242.0 lb

## 2023-03-10 DIAGNOSIS — Z6831 Body mass index (BMI) 31.0-31.9, adult: Secondary | ICD-10-CM

## 2023-03-10 DIAGNOSIS — R0602 Shortness of breath: Secondary | ICD-10-CM | POA: Insufficient documentation

## 2023-03-10 DIAGNOSIS — R5383 Other fatigue: Secondary | ICD-10-CM | POA: Insufficient documentation

## 2023-03-10 DIAGNOSIS — R948 Abnormal results of function studies of other organs and systems: Secondary | ICD-10-CM

## 2023-03-10 DIAGNOSIS — R7303 Prediabetes: Secondary | ICD-10-CM

## 2023-03-10 DIAGNOSIS — E668 Other obesity: Secondary | ICD-10-CM | POA: Diagnosis not present

## 2023-03-10 DIAGNOSIS — Z1331 Encounter for screening for depression: Secondary | ICD-10-CM

## 2023-03-10 DIAGNOSIS — F32A Depression, unspecified: Secondary | ICD-10-CM

## 2023-03-10 DIAGNOSIS — E669 Obesity, unspecified: Secondary | ICD-10-CM

## 2023-03-10 NOTE — Assessment & Plan Note (Signed)
Most recent A1c is  Lab Results  Component Value Date   HGBA1C 5.7 (H) 09/12/2022   HGBA1C 5.1 11/08/2016    Patient aware of disease state and risk of progression. This may contribute to abnormal cravings, fatigue and diabetic complications without having diabetes.   We reviewed treatment options which includes losing 7 to 10% of body weight, increasing physical activity to a goal of 150 minutes a week at moderate intensity. He may also be a candidate for pharmacoprophylaxis with metformin or incretin mimetic.   He has already been working on reducing simple and processed carbs from his diet

## 2023-03-10 NOTE — Assessment & Plan Note (Signed)
His indirect calorimetry was 1829 versus calculated at 2251 so his metabolism is lower than predicted.  We discussed contributors as well as ways to boost metabolism including increasing protein intake, getting at least 7 to 8 hours of sleep, increasing water intake and participating in strengthening exercises.

## 2023-03-10 NOTE — Progress Notes (Signed)
Chief Complaint:   OBESITY Tristan Merritt (MR# [id number]) is a 52 y.o. male who presents for evaluation and treatment of obesity and related comorbidities. Current BMI is Body mass index is 31.93 kg/m. Tristan Merritt has been struggling with his weight for many years and has been unsuccessful in either losing weight, maintaining weight loss, or reaching his healthy weight goal.  Tristan Merritt is currently in the action stage of change and ready to dedicate time achieving and maintaining a healthier weight. Tristan Merritt is interested in becoming our patient and working on intensive lifestyle modifications including (but not limited to) diet and exercise for weight loss.  Tristan Merritt's habits were reviewed today and are as follows: His family eats meals together, he thinks his family will eat healthier with him, his desired weight loss is 42 lbs, he has been heavy most of his life, he started gaining weight post college, his heaviest weight ever was 275 pounds, he has significant food cravings issues, he skips meals frequently, he is frequently drinking liquids with calories, he frequently eats larger portions than normal, and he struggles with emotional eating.  Depression Screen Tristan Merritt's Food and Mood (modified PHQ-9) score was 15.  Subjective:   1. Other fatigue Tristan Merritt admits to daytime somnolence and admits to waking up still tired. Patient has a history of symptoms of daytime fatigue and morning fatigue. Tristan Merritt generally gets 7 or 8 hours of sleep per night, and states that he has nightime awakenings and generally restful sleep. Snoring is present. Apneic episodes are present. Epworth Sleepiness Score is 1.   2. SOBOE (shortness of breath on exertion) Tristan Merritt notes increasing shortness of breath with exercising and seems to be worsening over time with weight gain. He notes getting out of breath sooner with activity than he used to. This has not gotten worse recently.  Tristan Merritt denies shortness of breath at rest or orthopnea.  3. Prediabetes Patient aware of disease state and risk of progression. This may contribute to abnormal cravings, fatigue and diabetic complications without having diabetes.   Most recent A1c is  Lab Results  Component Value Date   HGBA1C 5.7 (H) 09/12/2022   HGBA1C 5.1 11/08/2016   4. Abnormal metabolism His indirect calorimetry was 1829 versus calculated at 2251 so his metabolism is lower than predicted.  Assessment/Plan:   1. Other fatigue Tristan Merritt does feel that his weight is causing his energy to be lower than it should be. Fatigue may be related to obesity, depression or many other causes. Labs will be ordered, and in the meanwhile, Tristan Merritt will focus on self care including making healthy food choices, increasing physical activity and focusing on stress reduction.  - EKG 12-Lead - Vitamin B12  2. SOBOE (shortness of breath on exertion) Tristan Merritt does feel that he gets out of breath more easily that he used to when he exercises. Eddrick's shortness of breath appears to be obesity related and exercise induced. He has agreed to work on weight loss and gradually increase exercise to treat his exercise induced shortness of breath. Will continue to monitor closely.  3. Prediabetes We reviewed treatment options which includes losing 7 to 10% of body weight, increasing physical activity to a goal of 150 minutes a week at moderate intensity. He may also be a candidate for pharmacoprophylaxis with metformin or incretin mimetic. He has already been working on reducing simple and processed carbs from his diet.  - Comprehensive metabolic panel - Hemoglobin A1c - Insulin, random - Lipid Panel With LDL/HDL  Ratio - VITAMIN D 25 Hydroxy (Vit-D Deficiency, Fractures) - TSH - CRP High sensitivity  4. Abnormal metabolism We discussed contributors as well as ways to boost metabolism including increasing protein intake,  getting at least 7 to 8 hours of sleep, increasing water intake and participating in strengthening exercises.  5. Depression screening Tristan Merritt had a positive depression screening. Depression is commonly associated with obesity and often results in emotional eating behaviors. We will monitor this closely and work on CBT to help improve the non-hunger eating patterns. Referral to Psychology may be required if no improvement is seen as he continues in our clinic.  6. Class 1 obesity with serious comorbidity and body mass index (BMI) of 31.0 to 31.9 in adult, unspecified obesity type Patient has lost 16 pounds through lifestyle changes.  He has eliminated simple and processed carbs from his diet he is also taking pre and probiotics and discontinued the use of Nexium.  - VITAMIN D 25 Hydroxy (Vit-D Deficiency, Fractures) - CRP High sensitivity  Tristan Merritt is currently in the action stage of change and his goal is to continue with weight loss efforts. I recommend Tristan Merritt begin the structured treatment plan as follows:  He has agreed to the Category 3 Plan.  Exercise goals: All adults should avoid inactivity. Some physical activity is better than none, and adults who participate in any amount of physical activity gain some health benefits.   Behavioral modification strategies: increasing lean protein intake, decreasing simple carbohydrates, increasing vegetables, increasing water intake, decreasing liquid calories, increasing high fiber foods, no skipping meals, meal planning and cooking strategies, keeping healthy foods in the home, better snacking choices, and planning for success.  He was informed of the importance of frequent follow-up visits to maximize his success with intensive lifestyle modifications for his multiple health conditions. He was informed we would discuss his lab results at his next visit unless there is a critical issue that needs to be addressed sooner. Tristan Merritt agreed  to keep his next visit at the agreed upon time to discuss these results.  Objective:   Blood pressure 113/83, pulse 74, temperature 98.3 F (36.8 C), height 6\' 1"  (1.854 m), weight 242 lb (109.8 kg), SpO2 97 %. Body mass index is 31.93 kg/m.  EKG: Normal sinus rhythm, rate 69 BPM.  Indirect Calorimeter completed today shows a VO2 of 266 and a REE of 1829.  His calculated basal metabolic rate is 1610 thus his basal metabolic rate is worse than expected.  General: Cooperative, alert, well developed, in no acute distress. HEENT: Conjunctivae and lids unremarkable. Cardiovascular: Regular rhythm.  Lungs: Normal work of breathing. Neurologic: No focal deficits.   Lab Results  Component Value Date   CREATININE 1.10 09/12/2022   BUN 23 09/12/2022   NA 143 09/12/2022   K 4.9 09/12/2022   CL 104 09/12/2022   CO2 24 09/12/2022   Lab Results  Component Value Date   ALT 19 09/12/2022   AST 14 09/12/2022   ALKPHOS 62 10/22/2020   BILITOT 0.4 09/12/2022   Lab Results  Component Value Date   HGBA1C 5.7 (H) 09/12/2022   HGBA1C 5.6 10/22/2020   HGBA1C 5.1 11/08/2016   No results found for: "INSULIN" Lab Results  Component Value Date   TSH 1.67 10/22/2020   Lab Results  Component Value Date   CHOL 194 09/12/2022   HDL 55 09/12/2022   LDLCALC 110 (H) 09/12/2022   TRIG 175 (H) 09/12/2022   CHOLHDL 3.5 09/12/2022   Lab  Results  Component Value Date   WBC 7.9 09/12/2022   HGB 15.2 09/12/2022   HCT 44.3 09/12/2022   MCV 85.7 09/12/2022   PLT 298 09/12/2022   No results found for: "IRON", "TIBC", "FERRITIN"  Attestation Statements:   Reviewed by clinician on day of visit: allergies, medications, problem list, medical history, surgical history, family history, social history, and previous encounter notes.  Time spent on visit including pre-visit chart review and post-visit charting and care was 40 minutes.   Trude Mcburney, am acting as transcriptionist for Worthy Rancher, MD.  I have reviewed the above documentation for accuracy and completeness, and I agree with the above. -Worthy Rancher, MD

## 2023-03-10 NOTE — Assessment & Plan Note (Signed)
Patient has lost 16 pounds through lifestyle changes.  He has eliminated simple and processed carbs from his diet he is also taking pre and probiotics and discontinued the use of Nexium.

## 2023-03-11 LAB — TSH: TSH: 1.49 u[IU]/mL (ref 0.450–4.500)

## 2023-03-11 LAB — COMPREHENSIVE METABOLIC PANEL
ALT: 14 IU/L (ref 0–44)
AST: 12 IU/L (ref 0–40)
Albumin: 4.4 g/dL (ref 3.8–4.9)
Alkaline Phosphatase: 66 IU/L (ref 44–121)
BUN/Creatinine Ratio: 13 (ref 9–20)
BUN: 13 mg/dL (ref 6–24)
Bilirubin Total: 0.3 mg/dL (ref 0.0–1.2)
CO2: 23 mmol/L (ref 20–29)
Calcium: 9.3 mg/dL (ref 8.7–10.2)
Chloride: 102 mmol/L (ref 96–106)
Creatinine, Ser: 1.02 mg/dL (ref 0.76–1.27)
Globulin, Total: 2.5 g/dL (ref 1.5–4.5)
Glucose: 94 mg/dL (ref 70–99)
Potassium: 4.8 mmol/L (ref 3.5–5.2)
Sodium: 143 mmol/L (ref 134–144)
Total Protein: 6.9 g/dL (ref 6.0–8.5)
eGFR: 88 mL/min/{1.73_m2} (ref >59)

## 2023-03-11 LAB — LIPID PANEL WITH LDL/HDL RATIO
Cholesterol, Total: 169 mg/dL (ref 100–199)
HDL: 54 mg/dL (ref >39)
LDL Chol Calc (NIH): 101 mg/dL — ABNORMAL HIGH (ref 0–99)
LDL/HDL Ratio: 1.9 ratio (ref 0.0–3.6)
Triglycerides: 74 mg/dL (ref 0–149)
VLDL Cholesterol Cal: 14 mg/dL (ref 5–40)

## 2023-03-11 LAB — HIGH SENSITIVITY CRP: CRP, High Sensitivity: 1.48 mg/L (ref 0.00–3.00)

## 2023-03-11 LAB — HEMOGLOBIN A1C
Est. average glucose Bld gHb Est-mCnc: 114 mg/dL
Hgb A1c MFr Bld: 5.6 % (ref 4.8–5.6)

## 2023-03-11 LAB — INSULIN, RANDOM: INSULIN: 10 u[IU]/mL (ref 2.6–24.9)

## 2023-03-11 LAB — VITAMIN D 25 HYDROXY (VIT D DEFICIENCY, FRACTURES): Vit D, 25-Hydroxy: 37 ng/mL (ref 30.0–100.0)

## 2023-03-11 LAB — VITAMIN B12: Vitamin B-12: 546 pg/mL (ref 232–1245)

## 2023-03-24 ENCOUNTER — Ambulatory Visit (INDEPENDENT_AMBULATORY_CARE_PROVIDER_SITE_OTHER): Payer: BC Managed Care – PPO | Admitting: Internal Medicine

## 2023-03-24 ENCOUNTER — Encounter (INDEPENDENT_AMBULATORY_CARE_PROVIDER_SITE_OTHER): Payer: Self-pay | Admitting: Internal Medicine

## 2023-03-24 VITALS — BP 109/66 | HR 69 | Temp 98.1°F | Ht 73.0 in | Wt 241.0 lb

## 2023-03-24 DIAGNOSIS — M79675 Pain in left toe(s): Secondary | ICD-10-CM | POA: Insufficient documentation

## 2023-03-24 DIAGNOSIS — E88819 Insulin resistance, unspecified: Secondary | ICD-10-CM | POA: Diagnosis not present

## 2023-03-24 DIAGNOSIS — R948 Abnormal results of function studies of other organs and systems: Secondary | ICD-10-CM | POA: Diagnosis not present

## 2023-03-24 DIAGNOSIS — E668 Other obesity: Secondary | ICD-10-CM

## 2023-03-24 DIAGNOSIS — Z6831 Body mass index (BMI) 31.0-31.9, adult: Secondary | ICD-10-CM

## 2023-03-24 DIAGNOSIS — M79674 Pain in right toe(s): Secondary | ICD-10-CM | POA: Diagnosis not present

## 2023-03-24 DIAGNOSIS — E669 Obesity, unspecified: Secondary | ICD-10-CM

## 2023-03-24 NOTE — Assessment & Plan Note (Addendum)
His HOMA-IR is 2.32 which is elevated. Optimal level < 1.9. This is complex condition associated with genetics, ectopic fat and lifestyle factors. Insulin resistance may result in weight gain, abnormal cravings (particularly for carbs) and fatigue. This may result in additional weight gain and lead to pre-diabetes and diabetes if untreated.   His fasting blood glucose and hemoglobin A1c are within normal limits.  Lab Results  Component Value Date   HGBA1C 5.6 03/10/2023   Lab Results  Component Value Date   INSULIN 10.0 03/10/2023   Lab Results  Component Value Date   GLUCOSE 94 03/10/2023   GLUCOSE 104 (H) 09/14/2018    We reviewed treatment options which include losing 7 to 10% of body weight, increasing physical activity to a 150 minutes a week of moderate intensity.He may also be a candidate for pharmacoprophylaxis with metformin or incretin mimetic.

## 2023-03-24 NOTE — Progress Notes (Signed)
Office: [phone]  /  Fax: [phone]  WEIGHT SUMMARY AND BIOMETRICS  Vitals Temp: 98.1 F (36.7 C) BP: 109/66 Pulse Rate: 69 SpO2: 97 %   Anthropometric Measurements Height: 6\' 1"  (1.854 m) Weight: 241 lb (109.3 kg) BMI (Calculated): 31.8 Weight at Last Visit: 242 lb Weight Lost Since Last Visit: 1 lb Weight Gained Since Last Visit: 0 lb Starting Weight: 242 lb Total Weight Loss (lbs): 1 lb (0.454 kg) Peak Weight: 265 lb   Body Composition  Body Fat %: 31.4 % Fat Mass (lbs): 75.6 lbs Muscle Mass (lbs): 157.2 lbs Total Body Water (lbs): 113.2 lbs Visceral Fat Rating : 16    No data recorded Today's Visit #: 2  Starting Date: 03/10/23 The 10-year ASCVD risk score (Arnett DK, et al., 2019) is: 2.6%   HPI  Chief Complaint: OBESITY  Tristan Merritt is here to discuss his progress with his obesity treatment plan. He is on the the Category 2 Plan and states he is following his eating plan approximately 80 % of the time. He states he is exercising 20-30 minutes 3 times per week.  Interval History:  Since last office visit he has lost 1 pound.  He is here to follow-up on progress and discuss recent lab work. He reports good adherence to reduced calorie nutritional plan. He has been working on not skipping meals, increasing protein intake at every meal, eating more fruits, eating more vegetables, drinking more water, avoiding and / or reducing liquid calories, avoiding or reducing simple and processed carbohydrates, making healthier choices, thinking of starting to exercise, and reducing portion sizes  [name]: Denies problems with appetite and hunger signals.  Denies problems with satiety and satiation.  Denies problems with eating patterns and portion control.  Denies abnormal cravings. Denies feeling deprived or restricted.   Barriers identified: lack of time for self-care, work schedule, and abnormally slow metabolism for age.   Pharmacotherapy  for weight loss: He is currently taking no anti-obesity medication.    ASSESSMENT AND PLAN  TREATMENT PLAN FOR OBESITY:  Recommended Dietary Goals  Tristan Merritt is currently in the action stage of change. As such, his goal is to continue weight management plan. He has agreed to: continue current plan  Behavioral Intervention  We discussed the following Behavioral Modification Strategies today: increasing lean protein intake, decreasing simple carbohydrates , increasing vegetables, increasing lower glycemic fruits, increasing fiber rich foods, increasing water intake, work on meal planning and preparation, work on tracking and journaling calories using tracking application, and reading food labels .  Additional resources provided today: None  Recommended Physical Activity Goals  Tristan Merritt has been advised to work up to 150 minutes of moderate intensity aerobic activity a week and strengthening exercises 2-3 times per week for cardiovascular health, weight loss maintenance and preservation of muscle mass.   He has agreed to :  Start strengthening exercises with a goal of 2-3 sessions a week   Pharmacotherapy We discussed various medication options to help Tristan Merritt with his weight loss efforts and we both agreed to : continue with nutritional and behavioral strategies  ASSOCIATED CONDITIONS ADDRESSED TODAY  Abnormal metabolism Assessment & Plan: His indirect calorimetry was 1829 versus calculated at 2251 so his metabolism is lower than predicted.  We discussed contributors as well as ways to boost metabolism including increasing protein intake, getting at least 7 to 8 hours of sleep, increasing water intake and participating in strengthening exercises.   Insulin resistance Assessment & Plan: His HOMA-IR is 2.32  which is elevated. Optimal level < 1.9. This is complex condition associated with genetics, ectopic fat and lifestyle factors. Insulin resistance may result in weight  gain, abnormal cravings (particularly for carbs) and fatigue. This may result in additional weight gain and lead to pre-diabetes and diabetes if untreated.   His fasting blood glucose and hemoglobin A1c are within normal limits.  Lab Results  Component Value Date   HGBA1C 5.6 03/10/2023   Lab Results  Component Value Date   INSULIN 10.0 03/10/2023   Lab Results  Component Value Date   GLUCOSE 94 03/10/2023   GLUCOSE 104 (H) 09/14/2018    We reviewed treatment options which include losing 7 to 10% of body weight, increasing physical activity to a 150 minutes a week of moderate intensity.He may also be a candidate for pharmacoprophylaxis with metformin or incretin mimetic.     Class 1 obesity with serious comorbidity and body mass index (BMI) of 31.0 to 31.9 in adult, unspecified obesity type Assessment & Plan: Patient has lost 17 pounds through lifestyle changes.  He has been working on increasing protein intake and reducing carbohydrates.  He has an abnormally slow metabolism for age which will make weight loss more challenging we discussed measures to increase metabolism with emphasis on strengthening exercises and protein intake.  I have also advised to track and journal 3 to 4 days out of the week over the next 3 weeks so we can assess weight loss zone, caloric and protein intake.  If further calorie reduction is needed he may require an appetite suppressant.   Pain in toes of both feet Assessment & Plan: Tristan Merritt experienced an acute episode of swollen and tender toes which has improved.  He suspects it may be gout but symptoms have improved.  I counseled him on monitoring for now but if he develops another episode to make an appointment with primary care team to evaluate for possible crystal induced arthropathy.  He may also want to take a picture for reference while he gets an appointment.  We also talked about the use of the portal for scheduling appointments.     PHYSICAL  EXAM:  Blood pressure 109/66, pulse 69, temperature 98.1 F (36.7 C), height 6\' 1"  (1.854 m), weight 241 lb (109.3 kg), SpO2 97%. Body mass index is 31.8 kg/m.  General: He is overweight, cooperative, alert, well developed, and in no acute distress. PSYCH: Has normal mood, affect and thought process.   HEENT: EOMI, sclerae are anicteric. Lungs: Normal breathing effort, no conversational dyspnea. Extremities: No edema.  Neurologic: No gross sensory or motor deficits. No tremors or fasciculations noted.    DIAGNOSTIC DATA REVIEWED:  BMET    Component Value Date/Time   NA 143 03/10/2023 0949   K 4.8 03/10/2023 0949   CL 102 03/10/2023 0949   CO2 23 03/10/2023 0949   GLUCOSE 94 03/10/2023 0949   GLUCOSE 118 (H) 09/12/2022 1457   BUN 13 03/10/2023 0949   CREATININE 1.02 03/10/2023 0949   CREATININE 1.10 09/12/2022 1457   CALCIUM 9.3 03/10/2023 0949   Lab Results  Component Value Date   HGBA1C 5.6 03/10/2023   HGBA1C 5.1 11/08/2016   Lab Results  Component Value Date   INSULIN 10.0 03/10/2023   Lab Results  Component Value Date   TSH 1.490 03/10/2023   CBC    Component Value Date/Time   WBC 7.9 09/12/2022 1457   RBC 5.17 09/12/2022 1457   HGB 15.2 09/12/2022 1457   HCT 44.3  09/12/2022 1457   PLT 298 09/12/2022 1457   MCV 85.7 09/12/2022 1457   MCH 29.4 09/12/2022 1457   MCHC 34.3 09/12/2022 1457   RDW 12.4 09/12/2022 1457   Iron Studies No results found for: "IRON", "TIBC", "FERRITIN", "IRONPCTSAT" Lipid Panel     Component Value Date/Time   CHOL 169 03/10/2023 0949   TRIG 74 03/10/2023 0949   HDL 54 03/10/2023 0949   CHOLHDL 3.5 09/12/2022 1457   VLDL 14.8 10/22/2020 1023   LDLCALC 101 (H) 03/10/2023 0949   LDLCALC 110 (H) 09/12/2022 1457   Hepatic Function Panel     Component Value Date/Time   PROT 6.9 03/10/2023 0949   ALBUMIN 4.4 03/10/2023 0949   AST 12 03/10/2023 0949   ALT 14 03/10/2023 0949   ALKPHOS 66 03/10/2023 0949   BILITOT 0.3  03/10/2023 0949      Component Value Date/Time   TSH 1.490 03/10/2023 0949   Nutritional Lab Results  Component Value Date   VD25OH 37.0 03/10/2023     Return in about 3 weeks (around 04/14/2023) for For Weight Mangement with Dr. Rikki Spearing.Marland Kitchen He was informed of the importance of frequent follow up visits to maximize his success with intensive lifestyle modifications for his multiple health conditions.   ATTESTASTION STATEMENTS:  Reviewed by clinician on day of visit: allergies, medications, problem list, medical history, surgical history, family history, social history, and previous encounter notes.   I have spent 40 minutes in the care of the patient today including: preparing to see patient (e.g. review and interpretation of tests, old notes ), obtaining and/or reviewing separately obtained history, counseling and educating the patient, documenting clinical information in the electronic or other health care record, and independently interpreting results and communicating results to the patient,family, or caregiver   Worthy Rancher, MD

## 2023-03-24 NOTE — Assessment & Plan Note (Signed)
His indirect calorimetry was 1829 versus calculated at 2251 so his metabolism is lower than predicted.  We discussed contributors as well as ways to boost metabolism including increasing protein intake, getting at least 7 to 8 hours of sleep, increasing water intake and participating in strengthening exercises.

## 2023-03-24 NOTE — Assessment & Plan Note (Signed)
Tristan Merritt experienced an acute episode of swollen and tender toes which has improved.  He suspects it may be gout but symptoms have improved.  I counseled him on monitoring for now but if he develops another episode to make an appointment with primary care team to evaluate for possible crystal induced arthropathy.  He may also want to take a picture for reference while he gets an appointment.  We also talked about the use of the portal for scheduling appointments.

## 2023-03-24 NOTE — Assessment & Plan Note (Signed)
Patient has lost 17 pounds through lifestyle changes.  He has been working on increasing protein intake and reducing carbohydrates.  He has an abnormally slow metabolism for age which will make weight loss more challenging we discussed measures to increase metabolism with emphasis on strengthening exercises and protein intake.  I have also advised to track and journal 3 to 4 days out of the week over the next 3 weeks so we can assess weight loss zone, caloric and protein intake.  If further calorie reduction is needed he may require an appetite suppressant.

## 2023-04-23 ENCOUNTER — Ambulatory Visit (INDEPENDENT_AMBULATORY_CARE_PROVIDER_SITE_OTHER): Payer: BC Managed Care – PPO | Admitting: Internal Medicine

## 2023-04-23 ENCOUNTER — Encounter (INDEPENDENT_AMBULATORY_CARE_PROVIDER_SITE_OTHER): Payer: Self-pay | Admitting: Internal Medicine

## 2023-04-23 VITALS — BP 115/79 | HR 68 | Temp 98.0°F | Ht 73.0 in | Wt 241.0 lb

## 2023-04-23 DIAGNOSIS — R948 Abnormal results of function studies of other organs and systems: Secondary | ICD-10-CM | POA: Diagnosis not present

## 2023-04-23 DIAGNOSIS — Z6831 Body mass index (BMI) 31.0-31.9, adult: Secondary | ICD-10-CM

## 2023-04-23 DIAGNOSIS — E88819 Insulin resistance, unspecified: Secondary | ICD-10-CM | POA: Diagnosis not present

## 2023-04-23 DIAGNOSIS — E669 Obesity, unspecified: Secondary | ICD-10-CM | POA: Diagnosis not present

## 2023-04-23 NOTE — Assessment & Plan Note (Signed)
Patient has lost 17 pounds through lifestyle changes.  He has been working on increasing protein intake and reducing carbohydrates.  He has an abnormally slow metabolism for age which will make weight loss more challenging we discussed measures to increase metabolism with emphasis on strengthening exercises and protein intake.  I have also advised to track and journal 3 to 4 days out of the week over the next 3 weeks so we can assess weight loss zone, caloric and protein intake.  If further calorie reduction is needed he may require an antiobesity medication either incretin therapy or Qsymia.  I provided him with information on FDA approved drugs today.

## 2023-04-23 NOTE — Assessment & Plan Note (Signed)
His indirect calorimetry was 1829 versus calculated at 2251 so his metabolism is lower than predicted.  We discussed contributors as well as ways to boost metabolism including increasing protein intake, getting at least 7 to 8 hours of sleep, increasing water intake and participating in strengthening exercises.

## 2023-04-23 NOTE — Progress Notes (Signed)
Office: [phone]  /  Fax: [phone]  WEIGHT SUMMARY AND BIOMETRICS  Vitals Temp: 98 F (36.7 C) BP: 115/79 Pulse Rate: 68 SpO2: 95 %   Anthropometric Measurements Height: 6\' 1"  (1.854 m) Weight: 241 lb (109.3 kg) BMI (Calculated): 31.8 Weight at Last Visit: 241lb Weight Lost Since Last Visit: 0 Weight Gained Since Last Visit: 0 Starting Weight: 242lb Total Weight Loss (lbs): 1 lb (0.454 kg) Peak Weight: 265lb   Body Composition  Body Fat %: 30.9 % Fat Mass (lbs): 74.6 lbs Muscle Mass (lbs): 158.8 lbs Total Body Water (lbs): 113 lbs Visceral Fat Rating : 16    Today's Visit #: 3  Starting Date: 03/10/23   HPI  Chief Complaint: OBESITY  Tristan Merritt is here to discuss his progress with his obesity treatment plan. He is on the the Category 2 Plan and states he is following his eating plan approximately 50 % of the time. He states he is exercising 20-60 minutes 7 times per week.  Interval History:  Since last office visit he has maintained.  He reports having difficulty adhering to reduced calorie nutrition plan because of business travel and related meals.  When at home he is able to focus on getting lean sources of protein and more vegetables.  He has not been able to track and journal calories because of travel. He reports variable adherence to reduced calorie nutritional plan He has been working on not skipping meals, increasing protein intake at every meal, eating more vegetables, drinking more water, avoiding or reducing simple and processed carbohydrates, continues to exercise, and getting back on track following recent lapse  [name]: Reports problems with appetite and hunger signals.  Reports problems with satiety and satiation.  Denies problems with eating patterns and portion control.  Denies abnormal cravings. Denies feeling deprived or restricted.   Barriers identified: lack of time for self-care, strong hunger signals and  appetite, multiple competing priorities, frequent travel, and slow metabolism for age.   Pharmacotherapy for weight loss: He is currently taking no anti-obesity medication.    ASSESSMENT AND PLAN  TREATMENT PLAN FOR OBESITY:  Recommended Dietary Goals  Tristan Merritt is currently in the action stage of change. As such, his goal is to continue weight management plan. He has agreed to: continue current plan, patient also provided with our low-carb plan.  Behavioral Intervention  We discussed the following Behavioral Modification Strategies today: increasing lean protein intake, decreasing simple carbohydrates , increasing vegetables, increasing lower glycemic fruits, increasing fiber rich foods, avoiding skipping meals, increasing water intake, work on meal planning and preparation, work on tracking and journaling calories using tracking application, decreasing eating out or consumption of processed foods, and making healthy choices when eating convenient foods, planning for success, and staying on track while traveling and vacationing.  Additional resources provided today: Handout on FDA approved anti-obesity medications and adverse effects  Recommended Physical Activity Goals  Tristan Merritt has been advised to work up to 150 minutes of moderate intensity aerobic activity a week and strengthening exercises 2-3 times per week for cardiovascular health, weight loss maintenance and preservation of muscle mass.   He has agreed to :  Think about ways to increase daily physical activity and overcoming barriers to exercise  Pharmacotherapy We discussed various medication options to help Tristan Merritt and we both agreed to : start anti-obesity medication.  In addition to reduced calorie nutrition plan (RCNP), behavioral strategies and physical activity, Tristan Merritt would benefit from pharmacotherapy to assist  with hunger signals, satiety and cravings. This will reduce  obesity-related health risks by inducing weight loss, and help reduce food consumption and adherence to [facility]) . It may also improve QOL by improving self-confidence and reduce the  setbacks associated with metabolic adaptations.  He was provided with information on FDA approved medications.  We also discussed the benefits and risk associated with GLP-1 therapy.  He will think about the cost of Zepbound using discount card.  ASSOCIATED CONDITIONS ADDRESSED TODAY  Abnormal metabolism Assessment & Plan: His indirect calorimetry was 1829 versus calculated at 2251 so his metabolism is lower than predicted.  We discussed contributors as well as ways to boost metabolism including increasing protein intake, getting at least 7 to 8 hours of sleep, increasing water intake and participating in strengthening exercises.   Insulin resistance Assessment & Plan: His HOMA-IR is 2.32 which is elevated. Optimal level < 1.9. This is complex condition associated with genetics, ectopic fat and lifestyle factors. Insulin resistance may result in weight gain, abnormal cravings (particularly for carbs) and fatigue. This may result in additional weight gain and lead to pre-diabetes and diabetes if untreated.   His fasting blood glucose and hemoglobin A1c are within normal limits.  Lab Results  Component Value Date   HGBA1C 5.6 03/10/2023   Lab Results  Component Value Date   INSULIN 10.0 03/10/2023   Lab Results  Component Value Date   GLUCOSE 94 03/10/2023   GLUCOSE 104 (H) 09/14/2018    We have reviewed treatment options.  He benefits from a low-carb nutrition plan.  He is also a candidate for incretin therapy.  Patient will look at cost of therapy.    Class 1 obesity with serious comorbidity and body mass index (BMI) of 31.0 to 31.9 in adult, unspecified obesity type Assessment & Plan: Patient has lost 17 pounds through lifestyle changes.  He has been working on increasing protein intake and reducing  carbohydrates.  He has an abnormally slow metabolism for age which will make weight loss more challenging we discussed measures to increase metabolism with emphasis on strengthening exercises and protein intake.  I have also advised to track and journal 3 to 4 days out of the week over the next 3 weeks so we can assess weight loss zone, caloric and protein intake.  If further calorie reduction is needed he may require an antiobesity medication either incretin therapy or Qsymia.  I provided him with information on FDA approved drugs today.     PHYSICAL EXAM:  Blood pressure 115/79, pulse 68, temperature 98 F (36.7 C), height 6\' 1"  (1.854 m), weight 241 lb (109.3 kg), SpO2 95%. Body mass index is 31.8 kg/m.  General: He is overweight, cooperative, alert, well developed, and in no acute distress. PSYCH: Has normal mood, affect and thought process.   HEENT: EOMI, sclerae are anicteric. Lungs: Normal breathing effort, no conversational dyspnea. Extremities: No edema.  Neurologic: No gross sensory or motor deficits. No tremors or fasciculations noted.    DIAGNOSTIC DATA REVIEWED:  BMET    Component Value Date/Time   NA 143 03/10/2023 0949   K 4.8 03/10/2023 0949   CL 102 03/10/2023 0949   CO2 23 03/10/2023 0949   GLUCOSE 94 03/10/2023 0949   GLUCOSE 118 (H) 09/12/2022 1457   BUN 13 03/10/2023 0949   CREATININE 1.02 03/10/2023 0949   CREATININE 1.10 09/12/2022 1457   CALCIUM 9.3 03/10/2023 0949   Lab Results  Component Value Date   HGBA1C 5.6 03/10/2023  HGBA1C 5.1 11/08/2016   Lab Results  Component Value Date   INSULIN 10.0 03/10/2023   Lab Results  Component Value Date   TSH 1.490 03/10/2023   CBC    Component Value Date/Time   WBC 7.9 09/12/2022 1457   RBC 5.17 09/12/2022 1457   HGB 15.2 09/12/2022 1457   HCT 44.3 09/12/2022 1457   PLT 298 09/12/2022 1457   MCV 85.7 09/12/2022 1457   MCH 29.4 09/12/2022 1457   MCHC 34.3 09/12/2022 1457   RDW 12.4 09/12/2022  1457   Iron Studies No results found for: "IRON", "TIBC", "FERRITIN", "IRONPCTSAT" Lipid Panel     Component Value Date/Time   CHOL 169 03/10/2023 0949   TRIG 74 03/10/2023 0949   HDL 54 03/10/2023 0949   CHOLHDL 3.5 09/12/2022 1457   VLDL 14.8 10/22/2020 1023   LDLCALC 101 (H) 03/10/2023 0949   LDLCALC 110 (H) 09/12/2022 1457   Hepatic Function Panel     Component Value Date/Time   PROT 6.9 03/10/2023 0949   ALBUMIN 4.4 03/10/2023 0949   AST 12 03/10/2023 0949   ALT 14 03/10/2023 0949   ALKPHOS 66 03/10/2023 0949   BILITOT 0.3 03/10/2023 0949      Component Value Date/Time   TSH 1.490 03/10/2023 0949   Nutritional Lab Results  Component Value Date   VD25OH 37.0 03/10/2023     Return in about 3 weeks (around 05/14/2023) for For Weight Mangement with Dr. Rikki Spearing.Marland Kitchen He was informed of the importance of frequent follow up visits to maximize his success with intensive lifestyle modifications for his multiple health conditions.   ATTESTASTION STATEMENTS:  Reviewed by clinician on day of visit: allergies, medications, problem list, medical history, surgical history, family history, social history, and previous encounter notes.   I have spent 30 minutes in the care of the patient today including: preparing to see patient (e.g. review and interpretation of tests, old notes ), obtaining and/or reviewing separately obtained history, performing a medically appropriate examination or evaluation, counseling and educating the patient, documenting clinical information in the electronic or other health care record, and independently interpreting results and communicating results to the patient, family, or caregiver   Worthy Rancher, MD

## 2023-04-23 NOTE — Progress Notes (Deleted)
Office: [phone]  /  Fax: [phone]  WEIGHT SUMMARY AND BIOMETRICS  Vitals Temp: 98 F (36.7 C) BP: 115/79 Pulse Rate: 68 SpO2: 95 %   Anthropometric Measurements Height: 6\' 1"  (1.854 m) Weight: 241 lb (109.3 kg) BMI (Calculated): 31.8 Weight at Last Visit: 241lb Weight Lost Since Last Visit: 0 Weight Gained Since Last Visit: 0 Starting Weight: 242lb Total Weight Loss (lbs): 1 lb (0.454 kg) Peak Weight: 265lb   Body Composition  Body Fat %: 30.9 % Fat Mass (lbs): 74.6 lbs Muscle Mass (lbs): 158.8 lbs Total Body Water (lbs): 113 lbs Visceral Fat Rating : 16    No data recorded Today's Visit #: 3  Starting Date: 03/10/23   HPI  Chief Complaint: OBESITY  Tristan Merritt is here to discuss his progress with his obesity treatment plan. He is on the the Category 2 Plan and states he is following his eating plan approximately 50 % of the time. He states he is exercising 20-60 minutes 7 days  per week.  Interval History:  Since last office visit he has ***. He reports {EMADHERENCE:28838::"good adherence to reduced calorie nutritional plan."} He has been working on [employer]: {ACTIONS;DENIES/REPORTS:21021675::"Denies"} problems with appetite and hunger signals.  {ACTIONS;DENIES/REPORTS:21021675::"Denies"} problems with satiety and satiation.  {ACTIONS;DENIES/REPORTS:21021675::"Denies"} problems with eating patterns and portion control.  {ACTIONS;DENIES/REPORTS:21021675::"Denies"} abnormal cravings. {ACTIONS;DENIES/REPORTS:21021675::"Denies"} feeling deprived or restricted.   Barriers identified: {EMOBESITYBARRIERS:28841}.   Pharmacotherapy for weight loss: He is currently taking {EMPharmaco:28845}.    ASSESSMENT AND PLAN  TREATMENT PLAN FOR OBESITY:  Recommended Dietary Goals  Tristan Merritt is currently in the action stage of change. As such, his goal is to continue weight management plan. He has agreed to:  {EMWTLOSSPLAN:29297::"continue current plan"}  Behavioral Intervention  We discussed the following Behavioral Modification Strategies today: {EMWMwtlossstrategies:28914::"increasing lean protein intake","decreasing simple carbohydrates ","increasing vegetables","increasing lower glycemic fruits","increasing water intake","continue to practice mindfulness when eating","planning for success"}.  Additional resources provided today: {EMadditionalresources:29169::"None"}  Recommended Physical Activity Goals  Tristan Merritt has been advised to work up to 150 minutes of moderate intensity aerobic activity a week and strengthening exercises 2-3 times per week for cardiovascular health, weight loss maintenance and preservation of muscle mass.   He has agreed to :  {EMEXERCISE:28847::"Think about ways to increase daily physical activity and overcoming barriers to exercise"}  Pharmacotherapy We discussed various medication options to help Tristan Merritt with his weight loss efforts and we both agreed to : {EMagreedrx:29170::"continue with nutritional and behavioral strategies"}  ASSOCIATED CONDITIONS ADDRESSED TODAY  There are no diagnoses linked to this encounter.  PHYSICAL EXAM:  Blood pressure 115/79, pulse 68, temperature 98 F (36.7 C), height 6\' 1"  (1.854 m), weight 241 lb (109.3 kg), SpO2 95%. Body mass index is 31.8 kg/m.  General: He is overweight, cooperative, alert, well developed, and in no acute distress. PSYCH: Has normal mood, affect and thought process.   HEENT: EOMI, sclerae are anicteric. Lungs: Normal breathing effort, no conversational dyspnea. Extremities: No edema.  Neurologic: No gross sensory or motor deficits. No tremors or fasciculations noted.    DIAGNOSTIC DATA REVIEWED:  BMET    Component Value Date/Time   NA 143 03/10/2023 0949   K 4.8 03/10/2023 0949   CL 102 03/10/2023 0949   CO2 23 03/10/2023 0949   GLUCOSE 94 03/10/2023 0949   GLUCOSE 118 (H) 09/12/2022  1457   BUN 13 03/10/2023 0949   CREATININE 1.02 03/10/2023 0949   CREATININE 1.10 09/12/2022 1457   CALCIUM 9.3 03/10/2023 0949   Lab Results  Component Value Date   HGBA1C 5.6 03/10/2023   HGBA1C 5.1 11/08/2016   Lab Results  Component Value Date   INSULIN 10.0 03/10/2023   Lab Results  Component Value Date   TSH 1.490 03/10/2023   CBC    Component Value Date/Time   WBC 7.9 09/12/2022 1457   RBC 5.17 09/12/2022 1457   HGB 15.2 09/12/2022 1457   HCT 44.3 09/12/2022 1457   PLT 298 09/12/2022 1457   MCV 85.7 09/12/2022 1457   MCH 29.4 09/12/2022 1457   MCHC 34.3 09/12/2022 1457   RDW 12.4 09/12/2022 1457   Iron Studies No results found for: "IRON", "TIBC", "FERRITIN", "IRONPCTSAT" Lipid Panel     Component Value Date/Time   CHOL 169 03/10/2023 0949   TRIG 74 03/10/2023 0949   HDL 54 03/10/2023 0949   CHOLHDL 3.5 09/12/2022 1457   VLDL 14.8 10/22/2020 1023   LDLCALC 101 (H) 03/10/2023 0949   LDLCALC 110 (H) 09/12/2022 1457   Hepatic Function Panel     Component Value Date/Time   PROT 6.9 03/10/2023 0949   ALBUMIN 4.4 03/10/2023 0949   AST 12 03/10/2023 0949   ALT 14 03/10/2023 0949   ALKPHOS 66 03/10/2023 0949   BILITOT 0.3 03/10/2023 0949      Component Value Date/Time   TSH 1.490 03/10/2023 0949   Nutritional Lab Results  Component Value Date   VD25OH 37.0 03/10/2023     No follow-ups on file.Marland Kitchen He was informed of the importance of frequent follow up visits to maximize his success with intensive lifestyle modifications for his multiple health conditions.   ATTESTASTION STATEMENTS:  Reviewed by clinician on day of visit: allergies, medications, problem list, medical history, surgical history, family history, social history, and previous encounter notes.     Worthy Rancher, MD

## 2023-04-23 NOTE — Assessment & Plan Note (Signed)
His HOMA-IR is 2.32 which is elevated. Optimal level < 1.9. This is complex condition associated with genetics, ectopic fat and lifestyle factors. Insulin resistance may result in weight gain, abnormal cravings (particularly for carbs) and fatigue. This may result in additional weight gain and lead to pre-diabetes and diabetes if untreated.   His fasting blood glucose and hemoglobin A1c are within normal limits.  Lab Results  Component Value Date   HGBA1C 5.6 03/10/2023   Lab Results  Component Value Date   INSULIN 10.0 03/10/2023   Lab Results  Component Value Date   GLUCOSE 94 03/10/2023   GLUCOSE 104 (H) 09/14/2018    We have reviewed treatment options.  He benefits from a low-carb nutrition plan.  He is also a candidate for incretin therapy.  Patient will look at cost of therapy.

## 2023-06-01 ENCOUNTER — Ambulatory Visit: Payer: BC Managed Care – PPO | Admitting: Family Medicine

## 2023-06-01 ENCOUNTER — Encounter: Payer: Self-pay | Admitting: Family Medicine

## 2023-06-01 VITALS — BP 120/86 | HR 66 | Ht 73.0 in | Wt 242.0 lb

## 2023-06-01 DIAGNOSIS — Z23 Encounter for immunization: Secondary | ICD-10-CM | POA: Diagnosis not present

## 2023-06-01 DIAGNOSIS — K219 Gastro-esophageal reflux disease without esophagitis: Secondary | ICD-10-CM

## 2023-06-01 MED ORDER — PANTOPRAZOLE SODIUM 40 MG PO TBEC
40.0000 mg | DELAYED_RELEASE_TABLET | Freq: Every day | ORAL | 1 refills | Status: DC
Start: 2023-06-01 — End: 2023-06-25

## 2023-06-01 NOTE — Progress Notes (Signed)
Chief Complaint  Patient presents with   Gastroesophageal Reflux    GERD Tristan Merritt is a 52 y.o. male who presents for follow up of GERD. Takes Pepcid 20 mg/d.  He denies waterbrash, chest pain, cough, dysphagia, tarry stools, nighttime awakenings weight loss Aggravating factors and specific triggers include alcohol, bending, and large meals. Alleviating factors include H2 blockers and proton pump inhibitors Risk factors present for GERD include obesity.   Past Medical History:  Diagnosis Date   GERD (gastroesophageal reflux disease)    Joint pain    Muscle pain    Seasonal allergies     Exam BP 120/86   Pulse 66   Ht 6\' 1"  (1.854 m)   Wt 242 lb (109.8 kg)   SpO2 96%   BMI 31.93 kg/m  General:  well developed, well nourished, in no apparent distress Lungs:  clear to auscultation, breath sounds equal bilaterally, no respiratory distress, no wheezes Cardio:  RRR Abdomen:  abdomen soft, nontender; bowel sounds normal; no masses or organomegaly Psych: Normal affect and mood  Assessment and Plan  Gastroesophageal reflux disease, unspecified whether esophagitis present - Plan: pantoprazole (PROTONIX) 40 MG tablet, Ambulatory referral to Gastroenterology  Need for influenza vaccination - Plan: Flu vaccine trivalent PF, 6mos and older(Flulaval,Afluria,Fluarix,Fluzone)  Chronic, uncontrolled.  Start Protonix 40 mg daily for the next 60 days.  Okay to use famotidine 20 mg 1-2 times daily as needed.  He did mention he started to have difficulty losing weight and it feels better when he eats.  Will consider referral to GI for possible upper scope for duodenal ulcer evaluation.  Counseled on GERD diet and precautions Follow up as originally scheduled. Pt voiced understanding and agreement to the plan.  Jilda Roche [place], DO 06/01/23  5:00 PM

## 2023-06-01 NOTE — Patient Instructions (Addendum)
The only lifestyle changes that have data behind them are weight loss for the overweight/obese and elevating the head of the bed. Finding out which foods/positions are triggers is important.  OK to continue Pepcid as needed.   Transition to Pepcid 20 mg bid after the 60 days.  If you do not hear anything about your referral in the next 1-2 weeks, call our office and ask for an update.  Let us know if you need anything.

## 2023-06-04 ENCOUNTER — Encounter (INDEPENDENT_AMBULATORY_CARE_PROVIDER_SITE_OTHER): Payer: Self-pay | Admitting: Internal Medicine

## 2023-06-04 ENCOUNTER — Ambulatory Visit (INDEPENDENT_AMBULATORY_CARE_PROVIDER_SITE_OTHER): Payer: BC Managed Care – PPO | Admitting: Internal Medicine

## 2023-06-04 VITALS — BP 112/71 | HR 84 | Temp 98.7°F | Ht 73.0 in | Wt 238.0 lb

## 2023-06-04 DIAGNOSIS — E66811 Obesity, class 1: Secondary | ICD-10-CM | POA: Diagnosis not present

## 2023-06-04 DIAGNOSIS — K219 Gastro-esophageal reflux disease without esophagitis: Secondary | ICD-10-CM | POA: Diagnosis not present

## 2023-06-04 DIAGNOSIS — Z6831 Body mass index (BMI) 31.0-31.9, adult: Secondary | ICD-10-CM | POA: Diagnosis not present

## 2023-06-04 DIAGNOSIS — R948 Abnormal results of function studies of other organs and systems: Secondary | ICD-10-CM

## 2023-06-04 NOTE — Progress Notes (Addendum)
Office: [phone]  /  Fax: [phone]  WEIGHT SUMMARY AND BIOMETRICS  Vitals Temp: 98.7 F (37.1 C) BP: 112/71 Pulse Rate: 84 SpO2: 96 %   Anthropometric Measurements Height: 6\' 1"  (1.854 m) Weight: 238 lb (108 kg) BMI (Calculated): 31.41 Weight at Last Visit: 241 lb Weight Lost Since Last Visit: 3 lb Weight Gained Since Last Visit: 0 lb Starting Weight: 242 lb Total Weight Loss (lbs): 4 lb (1.814 kg) Peak Weight: 265 lb   Body Composition  Body Fat %: 30.6 % Fat Mass (lbs): 73 lbs Muscle Mass (lbs): 157.6 lbs Total Body Water (lbs): 110 lbs Visceral Fat Rating : 15    No data recorded Today's Visit #: 4  Starting Date: 03/10/23    Chief Complaint: OBESITY  HPI  Discussed the use of AI scribe software for clinical note transcription with the patient, who gave verbal consent to proceed.  History of Present Illness   The patient, who is currently managing obesity, reports adherence to a category two meal plan approximately 50-60% of the time. He engages in physical activity, specifically walking, for 30-40 minutes two to three times per week. Since the last office visit, the patient has lost three pounds.  The patient has been experiencing a significant resurgence of acid reflux, necessitating a return to Proton Pump Inhibitors (PPIs) under the guidance of his general practitioner. The reflux was described as a burning sensation, particularly noticeable when the stomach was empty. The patient has been managing this alongside his dietary modifications, which has proven challenging. He reports consuming mostly water throughout the day, with a couple of cups of low-acid coffee in the morning.  The patient's daily diet typically consists of a morning smoothie, a mid-morning snack of a mini Quest bar, lunch of tuna or a full Quest bar or a grilled chicken salad, an apple for an afternoon snack, and dinner of protein and vegetables. He has noticed that  carbohydrates, particularly crackers, help to settle his heartburn.  The patient has been stationary, with no recent travel, and has been able to maintain his diet and exercise routine. However, he has had to adjust his eating patterns to manage his heartburn, often needing to eat something when the heartburn flares up.  The patient's acid reflux has been severe enough to warrant a consultation with his general practitioner, who has prescribed a 60-day course of pantoprazole 40. The patient is not pleased about returning to PPIs, as his goal was to eliminate all prescription drugs.  The patient has expressed a desire to avoid weight loss medications if possible, due to his recent need to restart PPIs. He has been considering replacing one meal a day, possibly dinner, with a protein shake to reduce his daily caloric intake. He has also been contemplating increasing his physical activity to boost his metabolism, as he has a slower than predicted metabolism. The patient is committed to continuing his weight loss journey, despite the challenges posed by his acid reflux and slow metabolism.   Pharmacotherapy for weight loss: He is currently taking patient has declined pharmacotherapy in past.    ASSESSMENT AND PLAN  TREATMENT PLAN FOR OBESITY:  Recommended Dietary Goals  Taquan is currently in the action stage of change. As such, his goal is to continue weight management plan. He has agreed to: continue current plan  Behavioral Intervention  We discussed the following Behavioral Modification Strategies today: increasing lean protein intake, decreasing simple carbohydrates , increasing vegetables, increasing lower glycemic fruits, increasing fiber  rich foods, avoiding skipping meals, increasing water intake, continue to practice mindfulness when eating, and planning for success.  Additional resources provided today: None  Recommended Physical Activity Goals  Rizwan has been advised to  work up to 150 minutes of moderate intensity aerobic activity a week and strengthening exercises 2-3 times per week for cardiovascular health, weight loss maintenance and preservation of muscle mass.   He has agreed to :  We explored opportunities to increase physical activity throughout this week.  He feels he has a window of time in the morning and will work on starting to exercise before going to work.  He has a slow metabolism for age and I think would benefit tremendously from increasing volume of physical activity.  Pharmacotherapy We discussed various medication options to help Camillo with his weight loss efforts and we both agreed to : has declined pharmacotherapy  ASSOCIATED CONDITIONS ADDRESSED TODAY  Gastroesophageal reflux disease, unspecified whether esophagitis present Assessment & Plan: Because of worsening symptoms he had restarted PPI and is to take medication for 8 weeks as guided by primary care team.  He is concerned about long-term use of these medications due to reported safety concerns but he is experience some withdrawal associated with discontinuation of medication.  He may need a prolonged taper of drug with substitution using an H2 blocker and Tums to avoid rebound symptoms.  He had an endoscopy in the past but because of recurrent symptoms he may benefit from an upper endoscopy.  This is being coordinated by primary care team   Abnormal metabolism Assessment & Plan: His indirect calorimetry was 1829 versus calculated at 2251 so his metabolism is lower than predicted.  We discussed contributors as well as ways to boost metabolism including increasing protein intake, getting at least 7 to 8 hours of sleep, increasing water intake and participating in strengthening exercises.   Class 1 obesity with serious comorbidity and body mass index (BMI) of 31.0 to 31.9 in adult, unspecified obesity type Assessment & Plan: Gradual weight loss has been noted, with a decrease  of 3 pounds since the last visit. He is adhering to the category two meal plan 50-60% of the time and exercising 2-3 times per week. We discussed the importance of consistent exercise and diet modifications to boost metabolism and promote weight loss. We will continue the current diet and exercise regimen, consider increasing the volume of physical activity, and replace some meals with protein shakes to create a calorie deficit. We will check weight and body composition in 4 weeks.      PHYSICAL EXAM:  Blood pressure 112/71, pulse 84, temperature 98.7 F (37.1 C), height 6\' 1"  (1.854 m), weight 238 lb (108 kg), SpO2 96%. Body mass index is 31.4 kg/m.  General: He is overweight, cooperative, alert, well developed, and in no acute distress. PSYCH: Has normal mood, affect and thought process.   HEENT: EOMI, sclerae are anicteric. Lungs: Normal breathing effort, no conversational dyspnea. Extremities: No edema.  Neurologic: No gross sensory or motor deficits. No tremors or fasciculations noted.    DIAGNOSTIC DATA REVIEWED:  BMET    Component Value Date/Time   NA 143 03/10/2023 0949   K 4.8 03/10/2023 0949   CL 102 03/10/2023 0949   CO2 23 03/10/2023 0949   GLUCOSE 94 03/10/2023 0949   GLUCOSE 118 (H) 09/12/2022 1457   BUN 13 03/10/2023 0949   CREATININE 1.02 03/10/2023 0949   CREATININE 1.10 09/12/2022 1457   CALCIUM 9.3 03/10/2023 0949  Lab Results  Component Value Date   HGBA1C 5.6 03/10/2023   HGBA1C 5.1 11/08/2016   Lab Results  Component Value Date   INSULIN 10.0 03/10/2023   Lab Results  Component Value Date   TSH 1.490 03/10/2023   CBC    Component Value Date/Time   WBC 7.9 09/12/2022 1457   RBC 5.17 09/12/2022 1457   HGB 15.2 09/12/2022 1457   HCT 44.3 09/12/2022 1457   PLT 298 09/12/2022 1457   MCV 85.7 09/12/2022 1457   MCH 29.4 09/12/2022 1457   MCHC 34.3 09/12/2022 1457   RDW 12.4 09/12/2022 1457   Iron Studies No results found for: "IRON",  "TIBC", "FERRITIN", "IRONPCTSAT" Lipid Panel     Component Value Date/Time   CHOL 169 03/10/2023 0949   TRIG 74 03/10/2023 0949   HDL 54 03/10/2023 0949   CHOLHDL 3.5 09/12/2022 1457   VLDL 14.8 10/22/2020 1023   LDLCALC 101 (H) 03/10/2023 0949   LDLCALC 110 (H) 09/12/2022 1457   Hepatic Function Panel     Component Value Date/Time   PROT 6.9 03/10/2023 0949   ALBUMIN 4.4 03/10/2023 0949   AST 12 03/10/2023 0949   ALT 14 03/10/2023 0949   ALKPHOS 66 03/10/2023 0949   BILITOT 0.3 03/10/2023 0949      Component Value Date/Time   TSH 1.490 03/10/2023 0949   Nutritional Lab Results  Component Value Date   VD25OH 37.0 03/10/2023     No follow-ups on file.Marland Kitchen He was informed of the importance of frequent follow up visits to maximize his success with intensive lifestyle modifications for his multiple health conditions.   ATTESTASTION STATEMENTS:  Reviewed by clinician on day of visit: allergies, medications, problem list, medical history, surgical history, family history, social history, and previous encounter notes.     Worthy Rancher, MD

## 2023-06-05 NOTE — Assessment & Plan Note (Signed)
Gradual weight loss has been noted, with a decrease of 3 pounds since the last visit. He is adhering to the category two meal plan 50-60% of the time and exercising 2-3 times per week. We discussed the importance of consistent exercise and diet modifications to boost metabolism and promote weight loss. We will continue the current diet and exercise regimen, consider increasing the volume of physical activity, and replace some meals with protein shakes to create a calorie deficit. We will check weight and body composition in 4 weeks.

## 2023-06-05 NOTE — Assessment & Plan Note (Addendum)
Because of worsening symptoms he had restarted PPI and is to take medication for 8 weeks as guided by primary care team.  He is concerned about long-term use of these medications due to reported safety concerns but he is experience some withdrawal associated with discontinuation of medication.  He may need a prolonged taper of drug with substitution using an H2 blocker and Tums to avoid rebound symptoms.  He had an endoscopy in the past but because of recurrent symptoms he may benefit from an upper endoscopy.  This is being coordinated by primary care team

## 2023-06-05 NOTE — Assessment & Plan Note (Signed)
His indirect calorimetry was 1829 versus calculated at 2251 so his metabolism is lower than predicted.  We discussed contributors as well as ways to boost metabolism including increasing protein intake, getting at least 7 to 8 hours of sleep, increasing water intake and participating in strengthening exercises.

## 2023-06-10 ENCOUNTER — Encounter: Payer: Self-pay | Admitting: Gastroenterology

## 2023-06-25 ENCOUNTER — Other Ambulatory Visit: Payer: Self-pay | Admitting: Family Medicine

## 2023-06-25 DIAGNOSIS — K219 Gastro-esophageal reflux disease without esophagitis: Secondary | ICD-10-CM

## 2023-07-08 ENCOUNTER — Ambulatory Visit (INDEPENDENT_AMBULATORY_CARE_PROVIDER_SITE_OTHER): Payer: BC Managed Care – PPO | Admitting: Internal Medicine

## 2023-07-08 ENCOUNTER — Encounter (INDEPENDENT_AMBULATORY_CARE_PROVIDER_SITE_OTHER): Payer: Self-pay | Admitting: Internal Medicine

## 2023-07-08 VITALS — BP 116/82 | HR 70 | Temp 98.3°F | Ht 73.0 in | Wt 239.0 lb

## 2023-07-08 DIAGNOSIS — E66811 Obesity, class 1: Secondary | ICD-10-CM | POA: Diagnosis not present

## 2023-07-08 DIAGNOSIS — Z6834 Body mass index (BMI) 34.0-34.9, adult: Secondary | ICD-10-CM | POA: Diagnosis not present

## 2023-07-08 DIAGNOSIS — R7303 Prediabetes: Secondary | ICD-10-CM

## 2023-07-08 DIAGNOSIS — R948 Abnormal results of function studies of other organs and systems: Secondary | ICD-10-CM

## 2023-07-08 NOTE — Progress Notes (Signed)
Office: [phone]  /  Fax: [phone]  WEIGHT SUMMARY AND BIOMETRICS  Vitals Temp: 98.3 F (36.8 C) BP: 116/82 Pulse Rate: 70 SpO2: 97 %   Anthropometric Measurements Height: 6\' 1"  (1.854 m) Weight: 239 lb (108.4 kg) BMI (Calculated): 31.54 Weight at Last Visit: 238 lb Weight Lost Since Last Visit: 0 lb Weight Gained Since Last Visit: 1 lb Starting Weight: 242 lb Total Weight Loss (lbs): 3 lb (1.361 kg) Peak Weight: 265 lb   Body Composition  Body Fat %: 29.8 % Fat Mass (lbs): 71.4 lbs Muscle Mass (lbs): 160.2 lbs Total Body Water (lbs): 109.6 lbs Visceral Fat Rating : 15    No data recorded Today's Visit #: 5  Starting Date: 03/10/23   HPI  Chief Complaint: OBESITY  Tristan Merritt is here to discuss his progress with his obesity treatment plan. He is on the the Category 2 Plan and states he is following his eating plan approximately 20 % of the time. He states he is not exercising.  Interval History:   Discussed the use of AI scribe software for clinical note transcription with the patient, who gave verbal consent to proceed.  History of Present Illness    He reports a recent period of increased food exposure and late-night eating due to work commitments, which he feared might have negatively impacted his weight management efforts. However, he also notes that he made conscious efforts to maintain a balanced diet during this period, focusing on salads and protein-rich foods. Despite these efforts, the patient admits to gaining one pound since the last visit.  Bioimpedance information does suggest a decrease in VAT, SAT and gains in muscle mass.  In 2022, the patient's weight was recorded at 263 pounds, which he managed to reduce to 243 pounds by the end of the year. However, he experienced a weight gain after the holiday season, reaching 258 pounds in January of the current year. Since then, he has managed to reduce his weight to 239 pounds, which is his  lowest recorded weight.  The patient reports a regular breakfast routine of protein shakes made with Fairlife milk, which he believes contributes significantly to his daily protein intake. However, he expresses uncertainty about reaching the recommended daily protein intake of 130 grams. He also mentions consuming Quest bars as a source of protein.  The patient acknowledges the challenges of maintaining a regular physical activity routine due to his work schedule. However, he notes that he was on his feet for extended periods during his recent work commitments, which he believes might have contributed to an increase in muscle mass. He expresses a willingness to increase his physical activity, particularly on weekends.  Despite the recent weight gain, the patient notes positive changes in his body composition, including a decrease in body fat percentage and an increase in muscle mass. He also reports fitting into clothes that were previously too tight, suggesting a reduction in waist size. The patient expresses a desire to boost his metabolism and overcome his current weight plateau. He agrees to focus on increasing his protein intake and physical activity over the next four weeks.        Barriers identified: lack of time for self-care and multiple competing priorities.   Pharmacotherapy for weight loss: He is currently taking no anti-obesity medication and patient has declined pharmacotherapy in past.    ASSESSMENT AND PLAN  TREATMENT PLAN FOR OBESITY:  Recommended Dietary Goals  Tristan Merritt is currently in the action stage of change. As such,  his goal is to continue weight management plan. He has agreed to: Switch to a macro type of strategy.  I suspect patient has adaptive thermogenesis this is likely the reason why his metabolic rate is lower than predicted likely secondary to weight loss.  I would like for him to shoot for 135 g of protein per day and not worry so much about calorie  restriction for the next 4 weeks.  Behavioral Intervention  We discussed the following Behavioral Modification Strategies today: continue to work on maintaining a reduced calorie state, getting the recommended amount of protein, incorporating whole foods, making healthy choices, staying well hydrated and practicing mindfulness when eating..  Additional resources provided today: None  Recommended Physical Activity Goals  Tristan Merritt has been advised to work up to 150 minutes of moderate intensity aerobic activity a week and strengthening exercises 2-3 times per week for cardiovascular health, weight loss maintenance and preservation of muscle mass.   He has agreed to :  He will work on increasing volume of physical activity  Pharmacotherapy We discussed various medication options to help Tristan Merritt with his weight loss efforts and we both agreed to : continue with nutritional and behavioral strategies  ASSOCIATED CONDITIONS ADDRESSED TODAY  Prediabetes Assessment & Plan: He had an A1c of 5.7 in January of this year follow-up A1c was 5.6 and improved.  He will continue with nutrition and behavioral strategies for weight loss.  We have also discussed the carb insulin model in the past and he is aware about the importance of maintaining a diet low in simple and added sugars.     Abnormal metabolism Assessment & Plan: His indirect calorimetry was 1829 versus calculated at 2251 so his metabolism is lower than predicted.  We discussed contributors as well as ways to boost metabolism including increasing protein intake, getting at least 7 to 8 hours of sleep, increasing water intake and participating in strengthening exercises.   Class 1 obesity with body mass index (BMI) of 34.0 to 34.9 in adult, unspecified obesity type, unspecified whether serious comorbidity present Assessment & Plan: I reviewed his weight trends over the last 2 years.  I suspect the reason why his metabolism is  slower than predicted is secondary to adaptive thermogenesis associated with weight loss.  I do not think further restricting calories will be fruitful as it may worsen this metabolic process.  I will like for him to focus on strategies to boost his metabolism which will involve increasing protein intake to about 30% of his calories and increasing volume of physical activity.  We will assess response in 4 weeks.     PHYSICAL EXAM:  Blood pressure 116/82, pulse 70, temperature 98.3 F (36.8 C), height 6\' 1"  (1.854 m), weight 239 lb (108.4 kg), SpO2 97%. Body mass index is 31.53 kg/m.  General: He is overweight, cooperative, alert, well developed, and in no acute distress. PSYCH: Has normal mood, affect and thought process.   HEENT: EOMI, sclerae are anicteric. Lungs: Normal breathing effort, no conversational dyspnea. Extremities: No edema.  Neurologic: No gross sensory or motor deficits. No tremors or fasciculations noted.    DIAGNOSTIC DATA REVIEWED:  BMET    Component Value Date/Time   NA 143 03/10/2023 0949   K 4.8 03/10/2023 0949   CL 102 03/10/2023 0949   CO2 23 03/10/2023 0949   GLUCOSE 94 03/10/2023 0949   GLUCOSE 118 (H) 09/12/2022 1457   BUN 13 03/10/2023 0949   CREATININE 1.02 03/10/2023 0949   CREATININE  1.10 09/12/2022 1457   CALCIUM 9.3 03/10/2023 0949   Lab Results  Component Value Date   HGBA1C 5.6 03/10/2023   HGBA1C 5.1 11/08/2016   Lab Results  Component Value Date   INSULIN 10.0 03/10/2023   Lab Results  Component Value Date   TSH 1.490 03/10/2023   CBC    Component Value Date/Time   WBC 7.9 09/12/2022 1457   RBC 5.17 09/12/2022 1457   HGB 15.2 09/12/2022 1457   HCT 44.3 09/12/2022 1457   PLT 298 09/12/2022 1457   MCV 85.7 09/12/2022 1457   MCH 29.4 09/12/2022 1457   MCHC 34.3 09/12/2022 1457   RDW 12.4 09/12/2022 1457   Iron Studies No results found for: "IRON", "TIBC", "FERRITIN", "IRONPCTSAT" Lipid Panel     Component Value  Date/Time   CHOL 169 03/10/2023 0949   TRIG 74 03/10/2023 0949   HDL 54 03/10/2023 0949   CHOLHDL 3.5 09/12/2022 1457   VLDL 14.8 10/22/2020 1023   LDLCALC 101 (H) 03/10/2023 0949   LDLCALC 110 (H) 09/12/2022 1457   Hepatic Function Panel     Component Value Date/Time   PROT 6.9 03/10/2023 0949   ALBUMIN 4.4 03/10/2023 0949   AST 12 03/10/2023 0949   ALT 14 03/10/2023 0949   ALKPHOS 66 03/10/2023 0949   BILITOT 0.3 03/10/2023 0949      Component Value Date/Time   TSH 1.490 03/10/2023 0949   Nutritional Lab Results  Component Value Date   VD25OH 37.0 03/10/2023     Return in about 4 weeks (around 08/05/2023).Marland Kitchen He was informed of the importance of frequent follow up visits to maximize his success with intensive lifestyle modifications for his multiple health conditions.   ATTESTASTION STATEMENTS:  Reviewed by clinician on day of visit: allergies, medications, problem list, medical history, surgical history, family history, social history, and previous encounter notes.     Worthy Rancher, MD

## 2023-07-08 NOTE — Assessment & Plan Note (Signed)
His indirect calorimetry was 1829 versus calculated at 2251 so his metabolism is lower than predicted.  We discussed contributors as well as ways to boost metabolism including increasing protein intake, getting at least 7 to 8 hours of sleep, increasing water intake and participating in strengthening exercises.

## 2023-07-08 NOTE — Assessment & Plan Note (Signed)
I reviewed his weight trends over the last 2 years.  I suspect the reason why his metabolism is slower than predicted is secondary to adaptive thermogenesis associated with weight loss.  I do not think further restricting calories will be fruitful as it may worsen this metabolic process.  I will like for him to focus on strategies to boost his metabolism which will involve increasing protein intake to about 30% of his calories and increasing volume of physical activity.  We will assess response in 4 weeks.

## 2023-07-08 NOTE — Assessment & Plan Note (Signed)
He had an A1c of 5.7 in January of this year follow-up A1c was 5.6 and improved.  He will continue with nutrition and behavioral strategies for weight loss.  We have also discussed the carb insulin model in the past and he is aware about the importance of maintaining a diet low in simple and added sugars.

## 2023-08-06 ENCOUNTER — Ambulatory Visit (INDEPENDENT_AMBULATORY_CARE_PROVIDER_SITE_OTHER): Payer: BC Managed Care – PPO | Admitting: Internal Medicine

## 2023-08-18 ENCOUNTER — Encounter: Payer: Self-pay | Admitting: Family Medicine

## 2023-08-18 ENCOUNTER — Ambulatory Visit: Payer: BC Managed Care – PPO | Admitting: Family Medicine

## 2023-08-18 VITALS — BP 120/90 | HR 79 | Temp 98.3°F | Resp 18 | Ht 73.0 in | Wt 250.6 lb

## 2023-08-18 DIAGNOSIS — M5441 Lumbago with sciatica, right side: Secondary | ICD-10-CM

## 2023-08-18 MED ORDER — CYCLOBENZAPRINE HCL 10 MG PO TABS: 10.0000 mg | ORAL_TABLET | Freq: Three times a day (TID) | ORAL | 0 refills | Status: DC | PRN

## 2023-08-18 MED ORDER — PREDNISONE 10 MG PO TABS: ORAL_TABLET | ORAL | 0 refills | Status: DC

## 2023-08-18 NOTE — Progress Notes (Signed)
Established Patient Office Visit  Subjective   Patient ID: Tristan Merritt, male    DOB: August 19, 1971  Age: 52 y.o. MRN: [id number]  Chief Complaint  Patient presents with   Back Pain    Lower right, x2 weeks, pt thinks he pulled something while taking out the trash. Pt states pain was better until he [place]ezed in the airport    HPI Discussed the use of AI scribe software for clinical note transcription with the patient, who gave verbal consent to proceed.  History of Present Illness   The patient, with an unspecified medical history, presents with a two-week history of lower back pain. The pain began after an incident of improper movement while taking out the trash, which resulted in a severe spasm in the lower back. The pain was so intense that the patient had difficulty returning to the house. Over the following days, the pain gradually improved. However, a sneezing episode at an airport resulted in a sudden exacerbation of the pain, returning it to its initial severity.  The patient has a history of back pain, but it has been several years since the last episode, and no surgical intervention was required. The current pain is localized to the right lower back and is associated with muscle spasms and weakness in the legs. The patient also reports a sensation of pain radiating down the right side of the calf.  The patient sought massage therapy a few days after the onset of the pain, which provided some relief. The therapist noted tightness in the glutes and hamstrings and identified an "angry" spot in the lower back. The patient has also used a Salonpas patch, which seemed to help more than a heating pad.  The patient's pain is severe enough to limit his mobility and comfort, with the only comfortable position being in bed. The pain has also affected his sleep, as he has to carefully position himself to avoid exacerbating the pain. Despite the pain, the patient is still able to walk on his  heels and toes, although bending down to touch his toes is difficult.      Patient Active Problem List   Diagnosis Date Noted   Insulin resistance 03/24/2023   Pain in toes of both feet 03/24/2023   Prediabetes 03/10/2023   Abnormal metabolism 03/10/2023   Cervical radiculopathy 01/22/2021   Hyperglycemia 10/22/2020   Glaucoma 10/22/2020   Obesity 10/22/2020   Chronic seasonal allergic rhinitis 03/23/2019   Greater trochanteric bursitis of right hip 10/11/2018   Pes planus 10/11/2018   Gastroesophageal reflux disease 08/19/2018   White coat syndrome without diagnosis of hypertension 08/19/2018   Past Medical History:  Diagnosis Date   GERD (gastroesophageal reflux disease)    Joint pain    Muscle pain    Seasonal allergies    Past Surgical History:  Procedure Laterality Date   NASAL SINUS SURGERY  2000   Remove cyst   Social History   Tobacco Use   Smoking status: Never   Smokeless tobacco: Never  Vaping Use   Vaping status: Never Used  Substance Use Topics   Alcohol use: Yes    Alcohol/week: 1.0 - 2.0 standard drink of alcohol    Types: 1 - 2 Cans of beer per week   Drug use: Never   Social History   Socioeconomic History   Marital status: Media planner    Spouse name: girlfriend, Marlowe Aschoff   Number of children: 2   Years of education: Not on file  Highest education level: Bachelor's degree (e.g., BA, AB, BS)  Occupational History   Occupation: Engineer, structural: [employer]  Tobacco Use   Smoking status: Never   Smokeless tobacco: Never  Vaping Use   Vaping status: Never Used  Substance and Sexual Activity   Alcohol use: Yes    Alcohol/week: 1.0 - 2.0 standard drink of alcohol    Types: 1 - 2 Cans of beer per week   Drug use: Never   Sexual activity: Yes  Other Topics Concern   Not on file  Social History Narrative   Not on file   Social Drivers of Health   Financial Resource Strain: Low Risk  (05/27/2023)   Overall  Financial Resource Strain (CARDIA)    Difficulty of Paying Living Expenses: Not very hard  Food Insecurity: No Food Insecurity (05/27/2023)   Hunger Vital Sign    Worried About Running Out of Food in the Last Year: Never true    Ran Out of Food in the Last Year: Never true  Transportation Needs: No Transportation Needs (05/27/2023)   PRAPARE - Administrator, Civil Service (Medical): No    Lack of Transportation (Non-Medical): No  Physical Activity: Insufficiently Active (05/27/2023)   Exercise Vital Sign    Days of Exercise per Week: 2 days    Minutes of Exercise per Session: 20 min  Stress: Stress Concern Present (05/27/2023)   [employer] of Occupational Health - Occupational Stress Questionnaire    Feeling of Stress : To some extent  Social Connections: Socially Isolated (05/27/2023)   Social Connection and Isolation Panel [NHANES]    Frequency of Communication with Friends and Family: Once a week    Frequency of Social Gatherings with Friends and Family: Once a week    Attends Religious Services: Never    Database administrator or Organizations: No    Attends Engineer, structural: Not on file    Marital Status: Divorced  Intimate Partner Violence: Not on file   Family Status  Relation Name Status   Mother Lanora Manis Deceased   Father Durwin Nora Deceased   Brother Ramon Dredge Alive   Brother John Alive   Brother Galena Alive   MGM Gardiner Ramus Deceased   MGF  Deceased   PGM Gardiner Ramus Deceased   PGF Ramon Dredge Deceased   Son  Alive   Son  Alive  No partnership data on file   Family History  Problem Relation Age of Onset   Cancer Mother    Lymphoma Mother    Obesity Mother    Stroke Father    Hypertension Father    Diabetes Father        Borderline   HIV Brother    Glaucoma Brother    Multiple myeloma Maternal Grandmother    Heart disease Maternal Grandfather    Hypertension Paternal Grandmother    Stroke Paternal Grandmother    Healthy Son    Healthy Son     Allergies  Allergen Reactions   Biaxin [Clarithromycin] Nausea And Vomiting   Sulfa Antibiotics Hives      Review of Systems  Constitutional:  Negative for chills, fever and malaise/fatigue.  HENT:  Negative for congestion and hearing loss.   Eyes:  Negative for blurred vision and discharge.  Respiratory:  Negative for cough, sputum production and shortness of breath.   Cardiovascular:  Negative for chest pain, palpitations and leg swelling.  Gastrointestinal:  Negative for abdominal pain, blood in stool, constipation,  diarrhea, heartburn, nausea and vomiting.  Genitourinary:  Negative for dysuria, frequency, hematuria and urgency.  Musculoskeletal:  Negative for back pain, falls and myalgias.  Skin:  Negative for rash.  Neurological:  Negative for dizziness, sensory change, loss of consciousness, weakness and headaches.  Endo/Heme/Allergies:  Negative for environmental allergies. Does not bruise/bleed easily.  Psychiatric/Behavioral:  Negative for depression and suicidal ideas. The patient is not nervous/anxious and does not have insomnia.       Objective:     BP (!) 120/90 (BP Location: Left Arm, Patient Position: Sitting, Cuff Size: Large)   Pulse 79   Temp 98.3 F (36.8 C) (Oral)   Resp 18   Ht 6\' 1"  (1.854 m)   Wt 250 lb 9.6 oz (113.7 kg)   SpO2 97%   BMI 33.06 kg/m  BP Readings from Last 3 Encounters:  08/18/23 (!) 120/90  07/08/23 116/82  06/04/23 112/71   Wt Readings from Last 3 Encounters:  08/18/23 250 lb 9.6 oz (113.7 kg)  07/08/23 239 lb (108.4 kg)  06/04/23 238 lb (108 kg)   SpO2 Readings from Last 3 Encounters:  08/18/23 97%  07/08/23 97%  06/04/23 96%      Physical Exam Vitals and nursing note reviewed.  Constitutional:      General: He is not in acute distress.    Appearance: Normal appearance. He is well-developed.  HENT:     Head: Normocephalic and atraumatic.  Eyes:     General: No scleral icterus.       Right eye: No discharge.         Left eye: No discharge.  Cardiovascular:     Rate and Rhythm: Normal rate and regular rhythm.     Heart sounds: No murmur heard. Pulmonary:     Effort: Pulmonary effort is normal. No respiratory distress.     Breath sounds: Normal breath sounds.  Musculoskeletal:        General: Tenderness present. Normal range of motion.     Cervical back: Normal range of motion and neck supple.     Right lower leg: No edema.     Left lower leg: No edema.  Skin:    General: Skin is warm and dry.  Neurological:     General: No focal deficit present.     Mental Status: He is alert and oriented to person, place, and time.     Motor: Weakness present.     Gait: Gait and tandem walk normal.     Deep Tendon Reflexes: Reflexes are normal and symmetric. Reflexes normal.     Comments:    R hip flexer strength---- 3/5 R knee ext and flex normal  L leg strength normal   Psychiatric:        Mood and Affect: Mood normal.        Behavior: Behavior normal.        Thought Content: Thought content normal.        Judgment: Judgment normal.      No results found for any visits on 08/18/23.  Last vitamin D Lab Results  Component Value Date   VD25OH 37.0 03/10/2023   Last vitamin B12 and Folate Lab Results  Component Value Date   VITAMINB12 546 03/10/2023      The 10-year ASCVD risk score (Arnett DK, et al., 2019) is: 3.1%    Assessment & Plan:   Problem List Items Addressed This Visit   None Visit Diagnoses       Acute  right-sided low back pain with right-sided sciatica    -  Primary   Relevant Medications   predniSONE (DELTASONE) 10 MG tablet   cyclobenzaprine (FLEXERIL) 10 MG tablet     Assessment and Plan    Acute Low Back Pain with Muscle Spasm   He presents with acute low back pain and muscle spasm on the right side following a twisting motion, exacerbated by sneezing with associated muscle tightness in the glutes and hamstrings. The physical exam reveals right hip flexor  weakness, with normal strength and reflexes otherwise. The pain does not radiate significantly, suggesting a muscular etiology. We will start a prednisone taper and Flexeril (cyclobenzaprine) for treatment. He is advised to avoid NSAIDs while on prednisone to prevent gastrointestinal complications and to take Tylenol for pain. Ice packs are recommended for nerve pain and heat for muscle pain, along with stretching exercises and massage therapy. If there is no improvement in 10-14 days, we may consider x-rays or physical therapy. Insurance limitations regarding MRI without prior physical therapy were discussed unless severe symptoms are present. A follow-up in 10-14 days is recommended if there is no improvement.  General Health Maintenance   He is generally healthy with no regular medications. We advised on proper techniques for getting out of bed to avoid back strain and recommended topical analgesics like Salonpas or Aspercreme. Supportive measures like pillows when sneezing to prevent exacerbation of back pain are encouraged.        No follow-ups on file.    Donato Schultz, DO

## 2023-09-09 ENCOUNTER — Encounter (INDEPENDENT_AMBULATORY_CARE_PROVIDER_SITE_OTHER): Payer: Self-pay | Admitting: Internal Medicine

## 2023-09-09 ENCOUNTER — Ambulatory Visit (INDEPENDENT_AMBULATORY_CARE_PROVIDER_SITE_OTHER): Payer: BC Managed Care – PPO | Admitting: Internal Medicine

## 2023-09-09 VITALS — BP 126/84 | HR 76 | Temp 98.0°F | Ht 73.0 in | Wt 243.0 lb

## 2023-09-09 DIAGNOSIS — E66811 Obesity, class 1: Secondary | ICD-10-CM

## 2023-09-09 DIAGNOSIS — Z6832 Body mass index (BMI) 32.0-32.9, adult: Secondary | ICD-10-CM | POA: Diagnosis not present

## 2023-09-09 DIAGNOSIS — R948 Abnormal results of function studies of other organs and systems: Secondary | ICD-10-CM | POA: Diagnosis not present

## 2023-09-09 DIAGNOSIS — R7303 Prediabetes: Secondary | ICD-10-CM | POA: Diagnosis not present

## 2023-09-09 NOTE — Assessment & Plan Note (Signed)
 See obesity treatment plan

## 2023-09-09 NOTE — Assessment & Plan Note (Signed)
 He had an A1c of 5.7 in January of this year follow-up A1c was 5.6 and improved.  He will continue with nutrition and behavioral strategies for weight loss.  We have also discussed the carb insulin  model  is aware about the importance of maintaining a diet low in simple and added sugars.

## 2023-09-09 NOTE — Progress Notes (Signed)
 Office: [phone]  /  Fax: [phone]  Weight Summary And Biometrics  Vitals Temp: 98 F (36.7 C) BP: 126/84 Pulse Rate: 76 SpO2: 97 %   Anthropometric Measurements Height: 6' 1 (1.854 m) Weight: 243 lb (110.2 kg) BMI (Calculated): 32.07 Weight at Last Visit: 239 lb Weight Lost Since Last Visit: 0 lb Weight Gained Since Last Visit: 4 lb Starting Weight: 242 lb Total Weight Loss (lbs): 0 lb (0 kg) Peak Weight: 265 lb   Body Composition  Body Fat %: 31 % Fat Mass (lbs): 75.4 lbs Muscle Mass (lbs): 159.4 lbs Total Body Water (lbs): 112.4 lbs Visceral Fat Rating : 16    No data recorded Today's Visit #: 6  Starting Date: 03/10/23   Subjective   Chief Complaint: Obesity  Tristan Merritt is here to discuss his progress with his obesity treatment plan. He is on the high protein plan and states he is following his eating plan approximately 100 % of the time. He states he is not exercising.  Interval History:   Discussed the use of AI scribe software for clinical note transcription with the patient, who gave verbal consent to proceed.  History of Present Illness   The patient, with a history of obesity, presents for a medical weight management consultation. The patient reports a significant decrease in physical activity levels due to a sudden onset of severe lower back pain that occurred while taking out the trash in late November. The pain was so severe that it limited mobility to the point of difficulty moving from the bed to the bathroom. Despite a course of prednisone  and muscle relaxers, the pain persisted for three and a half weeks. The patient sought help from a massage therapist who identified a locked muscle in the hip as the source of the pain. After the therapist released the muscle, the pain resolved completely.  The patient acknowledges the importance of physical activity in weight management and reports a desire to increase activity levels now that the  back pain has resolved. During the holiday period, the patient was able to engage in more physical activity, including walks and housework. The patient also reports making dietary changes, including consuming high-protein shakes and incorporating more fruits and vegetables into meals. Despite these efforts, the patient reports a weight gain since the last visit, which he attributes to the period of decreased physical activity due to the back pain. The patient expresses a desire to see more progress in weight loss and is open to exploring different strategies to achieve this goal.  The patient also reports a history of insulin  resistance, which is being managed through dietary modifications. The patient is aware of the need to balance caloric intake and output and is making efforts to consume a high-protein, low-glycemic load diet. The patient is also considering the use of meal tracking apps to better monitor caloric intake and protein consumption. The patient is committed to making lifestyle changes to manage his weight and improve his health.   He has not been tracking or journaling     Orexigenic Control:  Denies problems with appetite and hunger signals.  Denies problems with satiety and satiation.  Denies problems with eating patterns and portion control.  Denies abnormal cravings. Denies feeling deprived or restricted.   Barriers identified: orthopedic problems, medical conditions or chronic pain affecting mobility, frequent travel, and work schedule.   Pharmacotherapy for weight loss: He is currently taking patient has declined pharmacotherapy in past.   Assessment and Plan  Treatment Plan For Obesity:  Recommended Dietary Goals  Mohit is currently in the action stage of change. As such, his goal is to continue weight management plan. He has agreed to: keep a food journal with a target of  1500 calories and 30-40 grams of protein per meal  Behavioral Intervention  We  discussed the following Behavioral Modification Strategies today: continue to work on maintaining a reduced calorie state, getting the recommended amount of protein, incorporating whole foods, making healthy choices, staying well hydrated and practicing mindfulness when eating..  Additional resources provided today: Handout on tracking and journaling  Recommended Physical Activity Goals  Jennie has been advised to work up to 150 minutes of moderate intensity aerobic activity a week and strengthening exercises 2-3 times per week for cardiovascular health, weight loss maintenance and preservation of muscle mass.   He has agreed to :  Think about enjoyable ways to increase daily physical activity and overcoming barriers to exercise and Increase physical activity in their day and reduce sedentary time (increase NEAT).  Pharmacotherapy  We discussed various medication options to help Aidynn with his weight loss efforts and we both agreed to : continue with nutritional and behavioral strategies and has declined pharmacotherapy  Associated Conditions Addressed and Impacted by Obesity Treatment  Prediabetes Assessment & Plan: He had an A1c of 5.7 in January of this year follow-up A1c was 5.6 and improved.  He will continue with nutrition and behavioral strategies for weight loss.  We have also discussed the carb insulin  model  is aware about the importance of maintaining a diet low in simple and added sugars.     Abnormal metabolism Assessment & Plan: His indirect calorimetry was 1829 versus calculated at 2251 so his metabolism is lower than predicted.  We discussed contributors as well as ways to boost metabolism including increasing protein intake, getting at least 7 to 8 hours of sleep, increasing water intake and participating in strengthening exercises.   Class 1 obesity with serious comorbidity and body mass index (BMI) of 32.0 to 32.9 in adult, unspecified obesity  type Assessment & Plan: See obesity treatment plan      Objective   Physical Exam:  Blood pressure 126/84, pulse 76, temperature 98 F (36.7 C), height 6' 1 (1.854 m), weight 243 lb (110.2 kg), SpO2 97%. Body mass index is 32.06 kg/m.  General: He is overweight, cooperative, alert, well developed, and in no acute distress. PSYCH: Has normal mood, affect and thought process.   HEENT: EOMI, sclerae are anicteric. Lungs: Normal breathing effort, no conversational dyspnea. Extremities: No edema.  Neurologic: No gross sensory or motor deficits. No tremors or fasciculations noted.    Diagnostic Data Reviewed:  BMET    Component Value Date/Time   NA 143 03/10/2023 0949   K 4.8 03/10/2023 0949   CL 102 03/10/2023 0949   CO2 23 03/10/2023 0949   GLUCOSE 94 03/10/2023 0949   GLUCOSE 118 (H) 09/12/2022 1457   BUN 13 03/10/2023 0949   CREATININE 1.02 03/10/2023 0949   CREATININE 1.10 09/12/2022 1457   CALCIUM 9.3 03/10/2023 0949   Lab Results  Component Value Date   HGBA1C 5.6 03/10/2023   HGBA1C 5.1 11/08/2016   Lab Results  Component Value Date   INSULIN  10.0 03/10/2023   Lab Results  Component Value Date   TSH 1.490 03/10/2023   CBC    Component Value Date/Time   WBC 7.9 09/12/2022 1457   RBC 5.17 09/12/2022 1457   HGB 15.2  09/12/2022 1457   HCT 44.3 09/12/2022 1457   PLT 298 09/12/2022 1457   MCV 85.7 09/12/2022 1457   MCH 29.4 09/12/2022 1457   MCHC 34.3 09/12/2022 1457   RDW 12.4 09/12/2022 1457   Iron Studies No results found for: IRON, TIBC, FERRITIN, IRONPCTSAT Lipid Panel     Component Value Date/Time   CHOL 169 03/10/2023 0949   TRIG 74 03/10/2023 0949   HDL 54 03/10/2023 0949   CHOLHDL 3.5 09/12/2022 1457   VLDL 14.8 10/22/2020 1023   LDLCALC 101 (H) 03/10/2023 0949   LDLCALC 110 (H) 09/12/2022 1457   Hepatic Function Panel     Component Value Date/Time   PROT 6.9 03/10/2023 0949   ALBUMIN 4.4 03/10/2023 0949   AST 12  03/10/2023 0949   ALT 14 03/10/2023 0949   ALKPHOS 66 03/10/2023 0949   BILITOT 0.3 03/10/2023 0949      Component Value Date/Time   TSH 1.490 03/10/2023 0949   Nutritional Lab Results  Component Value Date   VD25OH 37.0 03/10/2023    Follow-Up   Return in about 6 weeks (around 10/21/2023) for For Weight Mangement with Dr. Francyne.SABRA He was informed of the importance of frequent follow up visits to maximize his success with intensive lifestyle modifications for his multiple health conditions.  Attestation Statement   Reviewed by clinician on day of visit: allergies, medications, problem list, medical history, surgical history, family history, social history, and previous encounter notes.     Lucas Francyne, MD

## 2023-09-09 NOTE — Assessment & Plan Note (Signed)
His indirect calorimetry was 1829 versus calculated at 2251 so his metabolism is lower than predicted.  We discussed contributors as well as ways to boost metabolism including increasing protein intake, getting at least 7 to 8 hours of sleep, increasing water intake and participating in strengthening exercises.

## 2023-09-14 ENCOUNTER — Ambulatory Visit (INDEPENDENT_AMBULATORY_CARE_PROVIDER_SITE_OTHER): Payer: BC Managed Care – PPO | Admitting: Family Medicine

## 2023-09-14 ENCOUNTER — Encounter: Payer: Self-pay | Admitting: Family Medicine

## 2023-09-14 ENCOUNTER — Encounter: Payer: Self-pay | Admitting: Gastroenterology

## 2023-09-14 ENCOUNTER — Ambulatory Visit: Payer: BC Managed Care – PPO | Admitting: Gastroenterology

## 2023-09-14 VITALS — BP 120/82 | HR 91 | Ht 73.0 in | Wt 249.5 lb

## 2023-09-14 VITALS — BP 128/80 | HR 96 | Temp 98.0°F | Resp 16 | Ht 73.0 in | Wt 248.6 lb

## 2023-09-14 DIAGNOSIS — Z0000 Encounter for general adult medical examination without abnormal findings: Secondary | ICD-10-CM | POA: Diagnosis not present

## 2023-09-14 DIAGNOSIS — K219 Gastro-esophageal reflux disease without esophagitis: Secondary | ICD-10-CM

## 2023-09-14 DIAGNOSIS — R7303 Prediabetes: Secondary | ICD-10-CM | POA: Diagnosis not present

## 2023-09-14 DIAGNOSIS — Z125 Encounter for screening for malignant neoplasm of prostate: Secondary | ICD-10-CM | POA: Diagnosis not present

## 2023-09-14 NOTE — Addendum Note (Signed)
 Addended by: Lamona Curl on: 09/14/2023 10:12 AM   Modules accepted: Orders

## 2023-09-14 NOTE — Patient Instructions (Addendum)

## 2023-09-14 NOTE — Patient Instructions (Signed)
 _______________________________________________________  If your blood pressure at your visit was 140/90 or greater, please contact your primary care physician to follow up on this.  If you are age 53 or younger, your body mass index should be between 19-25. Your Body mass index is 32.92 kg/m. If this is out of the aformentioned range listed, please consider follow up with your Primary Care Provider.  ________________________________________________________  The [facility] providers would like to encourage you to use MYCHART to communicate with providers for non-urgent requests or questions.  Due to long hold times on the telephone, sending your provider a message by [facility] may be a faster and more efficient way to get a response.  Please allow 48 business hours for a response.  Please remember that this is for non-urgent requests.  _______________________________________________________  CONTINUE: Protonix  once daily.  You have been scheduled for an endoscopy. Please follow written instructions given to you at your visit today.  If you use inhalers (even only as needed), please bring them with you on the day of your procedure.  If you take any of the following medications, they will need to be adjusted prior to your procedure:   DO NOT TAKE 7 DAYS PRIOR TO TEST- Trulicity (dulaglutide) Ozempic , Wegovy  (semaglutide ) Mounjaro (tirzepatide ) Bydureon Bcise (exanatide extended release)  DO NOT TAKE 1 DAY PRIOR TO YOUR TEST Rybelsus  (semaglutide ) Adlyxin (lixisenatide) Victoza (liraglutide) Byetta (exanatide) ___________________________________________________________________________  Due to recent changes in healthcare laws, you may see the results of your imaging and laboratory studies on MyChart before your provider has had a chance to review them.  We understand that in some cases there may be results that are confusing or concerning to you. Not all laboratory results come back in the  same time frame and the provider may be waiting for multiple results in order to interpret others.  Please give us  48 hours in order for your provider to thoroughly review all the results before contacting the office for clarification of your results.   Thank you for entrusting me with your care and choosing [facility].  Dr Stacia

## 2023-09-14 NOTE — Progress Notes (Signed)
 Discussed the use of AI scribe software for clinical note transcription with the patient, who gave verbal consent to proceed.  HPI : Tristan Merritt is a 53 y.o. male who is referred to us  by Tristan Merritt further evaluation management of chronic GERD. He reports having longstanding GERD symptoms, well over 20 years..  He had been on Nexium for over 20 years and successfully weaned off over a period of six to eight weeks back in May 2024. He remained symptom-free for approximately two to three months before experiencing a resurgence of reflux symptoms, described as severe. He returned to his primary care physician and was prescribed a proton pump inhibitor (PPI), Pantoprazole , which has been effective in controlling his symptoms.  The patient's symptoms flared up again around August or September, after discontinuing Nexium in May or June. His GERD symptoms are typical, characterized by a burning sensation in the upper abdomen and chest, centrally located above the stomach. He occasionally experiences acid reflux, with a taste of acid in his mouth.  The patient has made significant dietary modifications to manage his symptoms, identifying and avoiding trigger foods. He noticed that his symptoms worsen when his stomach is empty, particularly if he skips lunch, and after consuming too much coffee. He also noted that his symptoms are less severe when on PPI therapy, allowing him to have a more varied diet.  The patient has experienced swallowing difficulties in the past, but not recently. He attributes this improvement to dietary changes. He has lost some weight over the past year, from around 255-258 lbs to below 240 lbs, although he regained some weight over the holidays. He has never smoked, and there is no family history of esophageal or stomach cancer. His last upper endoscopy was in the early 2000s when he first started experiencing reflux symptoms.        Past Medical History:   Diagnosis Date   GERD (gastroesophageal reflux disease)    Joint pain    Muscle pain    Seasonal allergies      Past Surgical History:  Procedure Laterality Date   NASAL SINUS SURGERY  2000   Remove cyst   Family History  Problem Relation Age of Onset   Cancer Mother    Lymphoma Mother    Obesity Mother    Stroke Father    Hypertension Father    Diabetes Father        Borderline   HIV Brother    Glaucoma Brother    Multiple myeloma Maternal Grandmother    Heart disease Maternal Grandfather    Hypertension Paternal Grandmother    Stroke Paternal Grandmother    Healthy Son    Healthy Son    Social History   Tobacco Use   Smoking status: Never   Smokeless tobacco: Never  Vaping Use   Vaping status: Never Used  Substance Use Topics   Alcohol use: Not Currently    Alcohol/week: 1.0 - 2.0 standard drink of alcohol    Types: 1 - 2 Cans of beer per week   Drug use: Never   Current Outpatient Medications  Medication Sig Dispense Refill   cetirizine (ZYRTEC) 10 MG tablet Take 10 mg by mouth daily.     latanoprost (XALATAN) 0.005 % ophthalmic solution SMARTSIG:In Eye(s)     pantoprazole  (PROTONIX ) 40 MG tablet Take 1 tablet (40 mg total) by mouth daily. 90 tablet 1   No current facility-administered medications for this visit.   Allergies  Allergen Reactions  Biaxin [Clarithromycin] Nausea And Vomiting   Sulfa Antibiotics Hives     Review of Systems: All systems reviewed and negative except where noted in HPI.    No results found.  Physical Exam: BP 120/82 (BP Location: Left Arm, Patient Position: Sitting, Cuff Size: Normal)   Pulse 91   Ht 6' 1 (1.854 m)   Wt 249 lb 8 oz (113.2 kg)   SpO2 95%   BMI 32.92 kg/m  Constitutional: Pleasant,well-developed, Caucasian male in no acute distress. HEENT: Normocephalic and atraumatic. Conjunctivae are normal. No scleral icterus. Neck supple.  Cardiovascular: Normal rate, regular rhythm.  Pulmonary/chest:  Effort normal and breath sounds normal. No wheezing, rales or rhonchi. Abdominal: Soft, nondistended, nontender. Bowel sounds active throughout. There are no masses palpable. No hepatomegaly. Extremities: no edema Neurological: Alert and oriented to person place and time. Skin: Skin is warm and dry. No rashes noted. Psychiatric: Normal mood and affect. Behavior is normal.  CBC    Component Value Date/Time   WBC 7.9 09/12/2022 1457   RBC 5.17 09/12/2022 1457   HGB 15.2 09/12/2022 1457   HCT 44.3 09/12/2022 1457   PLT 298 09/12/2022 1457   MCV 85.7 09/12/2022 1457   MCH 29.4 09/12/2022 1457   MCHC 34.3 09/12/2022 1457   RDW 12.4 09/12/2022 1457   LYMPHSABS 1.8 09/14/2018 0935   MONOABS 0.5 09/14/2018 0935   EOSABS 0.2 09/14/2018 0935   BASOSABS 0.0 09/14/2018 0935    CMP     Component Value Date/Time   NA 143 03/10/2023 0949   K 4.8 03/10/2023 0949   CL 102 03/10/2023 0949   CO2 23 03/10/2023 0949   GLUCOSE 94 03/10/2023 0949   GLUCOSE 118 (H) 09/12/2022 1457   BUN 13 03/10/2023 0949   CREATININE 1.02 03/10/2023 0949   CREATININE 1.10 09/12/2022 1457   CALCIUM 9.3 03/10/2023 0949   PROT 6.9 03/10/2023 0949   ALBUMIN 4.4 03/10/2023 0949   AST 12 03/10/2023 0949   ALT 14 03/10/2023 0949   ALKPHOS 66 03/10/2023 0949   BILITOT 0.3 03/10/2023 0949       Latest Ref Rng & Units 09/12/2022    2:57 PM 10/22/2020   10:23 AM 09/14/2018    9:35 AM  CBC EXTENDED  WBC 3.8 - 10.8 Thousand/uL 7.9  6.7  5.7   RBC 4.20 - 5.80 Million/uL 5.17  4.84  4.81   Hemoglobin 13.2 - 17.1 g/dL 84.7  85.8  85.8   HCT 38.5 - 50.0 % 44.3  41.7  41.7   Platelets 140 - 400 Thousand/uL 298  280.0  278.0   NEUT# 1.4 - 7.7 K/uL   3.1   Lymph# 0.7 - 4.0 K/uL   1.8       ASSESSMENT AND PLAN:  53 year old male with longstanding typical GERD symptoms, well-controlled with once daily PPI.  He had been able to wean off Nexium last year for several months, but his symptoms returned.  He thinks this  may have been secondary to dietary indiscretion.  Gastroesophageal Reflux Disease (GERD) Long-standing GERD with symptoms of burning sensation in the upper abdomen and chest, and occasional acid taste in the mouth. Symptoms exacerbated after discontinuation of Nexium, currently well-controlled on once daily pantoprazole . Avoids trigger foods such as tomatoes, alcohol, and coffee. No recent dysphagia or significant weight changes. No family history of esophageal or gastric cancer. Previous upper endoscopy in early 2000s. I have reviewed the indications, risks, and benefits of PPI therapy with the patient today.  I have discussed studies that suggest an increased risk of osteoporosis/pathologic fracture and kidney disease.  I also discussed studies suggesting other potential adverse consequences such as increased risk of dementia, CAD and CVA and explained that these studies show very weak associations of unclear significance and not clear cause and effect. We did discuss the potential for vitamin malabsorption, to include magnesium (very rare), calcium (easily modifiable with Calcium Citrate supplement), vitamin B12 (again, correctable with oral B12 supplement), and iron (although rarely clinically significant outside patients who require iron supplementation previously), and can monitor each of these periodically with routine labs. We have agreed to continue PPI treatment in this case. Benefits outweigh risks.  Barrett's risk factors to include patient's age, sex and obesity.  Discussed upper endoscopy to screen for Barrett's esophagus, with follow-up endoscopies every 3-5 years if diagnosed. - Continue pantoprazole  40 mg daily - Consider reducing to 20 mg daily if symptoms remain controlled - Upper endoscopy to screen for Barrett's esophagus - Monitor renal function annually while on PPI  - Dietary modifications as discussed to control GERD   Colon cancer screening  Negative [name] 2022, will be due  again this year   Tristan Goffredo E. Stacia, MD [facility]  I spent a total of 35 minutes reviewing the patient's medical record, interviewing and examining the patient, discussing his diagnosis and management of his condition going forward, and documenting in the medical record   Tristan Mabel Mt*

## 2023-09-14 NOTE — Progress Notes (Signed)
 Chief Complaint  Patient presents with   Annual Exam    Annual Exam     Well Male Tristan Merritt is here for a complete physical.   His last physical was >1 year ago.  Current diet: in general, a healthy diet.  Current exercise: walking Weight trend: stable Fatigue out of ordinary? No. Seat belt? Yes.   Advanced directive? No  Health maintenance Shingrix- No CCS- Yes Tetanus- Yes HIV- Yes Hep C- Yes   Past Medical History:  Diagnosis Date   GERD (gastroesophageal reflux disease)    Joint pain    Muscle pain    Seasonal allergies       Past Surgical History:  Procedure Laterality Date   NASAL SINUS SURGERY  2000   Remove cyst    Medications  Current Outpatient Medications on File Prior to Visit  Medication Sig Dispense Refill   cetirizine (ZYRTEC) 10 MG tablet Take 10 mg by mouth daily.     latanoprost (XALATAN) 0.005 % ophthalmic solution SMARTSIG:In Eye(s)     pantoprazole  (PROTONIX ) 40 MG tablet Take 1 tablet (40 mg total) by mouth daily. 90 tablet 1   No current facility-administered medications on file prior to visit.     Allergies Allergies  Allergen Reactions   Biaxin [Clarithromycin] Nausea And Vomiting   Sulfa Antibiotics Hives    Family History Family History  Problem Relation Age of Onset   Cancer Mother    Lymphoma Mother    Obesity Mother    Stroke Father    Hypertension Father    Diabetes Father        Borderline   HIV Brother    Glaucoma Brother    Multiple myeloma Maternal Grandmother    Heart disease Maternal Grandfather    Hypertension Paternal Grandmother    Stroke Paternal Grandmother    Healthy Son    Healthy Son     Review of Systems: Constitutional:  no fevers Eye:  no recent significant change in vision Ear/Nose/Mouth/Throat:  Ears:  no hearing loss Nose/Mouth/Throat:  no complaints of nasal congestion, no sore throat Cardiovascular:  no chest pain Respiratory:  no shortness of breath Gastrointestinal:  no  change in bowel habits GU:  Male: negative for dysuria, frequency Musculoskeletal/Extremities:  no joint pain Integumentary (Skin/Breast):  no abnormal skin lesions reported Neurologic:  no headaches Endocrine: No unexpected weight changes Hematologic/Lymphatic:  no abnormal bleeding  Exam BP 128/80   Pulse 96   Temp 98 F (36.7 C) (Oral)   Resp 16   Ht 6' 1 (1.854 m)   Wt 248 lb 9.6 oz (112.8 kg)   SpO2 100%   BMI 32.80 kg/m  General:  well developed, well nourished, in no apparent distress Skin:  no significant moles, warts, or growths Head:  no masses, lesions, or tenderness Eyes:  pupils equal and round, sclera anicteric without injection Ears:  canals without lesions, TMs shiny without retraction, no obvious effusion, no erythema Nose:  nares patent, mucosa normal Throat/Pharynx:  lips and gingiva without lesion; tongue and uvula midline; non-inflamed pharynx; no exudates or postnasal drainage Neck: neck supple without adenopathy, thyromegaly, or masses Cardiac: RRR, no bruits, no LE edema Lungs:  clear to auscultation, breath sounds equal bilaterally, no respiratory distress Abdomen: BS+, soft, non-tender, non-distended, no masses or organomegaly noted Rectal: Deferred Musculoskeletal:  symmetrical muscle groups noted without atrophy or deformity Neuro:  gait normal; deep tendon reflexes normal and symmetric Psych: well oriented with normal range of affect and  appropriate judgment/insight  Assessment and Plan  Well adult exam - Plan: CBC, Comprehensive metabolic panel, Lipid panel  Prediabetes - Plan: Hemoglobin A1c  Screening for prostate cancer - Plan: PSA   Well 52 y.o. male. Counseled on diet and exercise. Counseled on risks and benefits of prostate cancer screening with PSA. The patient agrees to undergo testing. Advanced directive form provided today.  Shingrix rec'd.  Immunizations, labs, and further orders as above. Follow up in 1 yr. The patient  voiced understanding and agreement to the plan.  Mabel [place], DO 09/14/23 2:56 PM

## 2023-09-15 LAB — LIPID PANEL
Cholesterol: 163 mg/dL (ref 0–200)
HDL: 49.4 mg/dL (ref >39.00)
LDL Cholesterol: 97 mg/dL (ref 0–99)
NonHDL: 113.82
Total CHOL/HDL Ratio: 3
Triglycerides: 83 mg/dL (ref 0.0–149.0)
VLDL: 16.6 mg/dL (ref 0.0–40.0)

## 2023-09-15 LAB — COMPREHENSIVE METABOLIC PANEL
ALT: 16 U/L (ref 0–53)
AST: 16 U/L (ref 0–37)
Albumin: 4.4 g/dL (ref 3.5–5.2)
Alkaline Phosphatase: 63 U/L (ref 39–117)
BUN: 21 mg/dL (ref 6–23)
CO2: 30 meq/L (ref 19–32)
Calcium: 9.4 mg/dL (ref 8.4–10.5)
Chloride: 102 meq/L (ref 96–112)
Creatinine, Ser: 1.1 mg/dL (ref 0.40–1.50)
GFR: 76.49 mL/min (ref >60.00)
Glucose, Bld: 83 mg/dL (ref 70–99)
Potassium: 4.5 meq/L (ref 3.5–5.1)
Sodium: 141 meq/L (ref 135–145)
Total Bilirubin: 0.4 mg/dL (ref 0.2–1.2)
Total Protein: 6.8 g/dL (ref 6.0–8.3)

## 2023-09-15 LAB — CBC
HCT: 40 % (ref 39.0–52.0)
Hemoglobin: 13.3 g/dL (ref 13.0–17.0)
MCHC: 33.2 g/dL (ref 30.0–36.0)
MCV: 87.6 fL (ref 78.0–100.0)
Platelets: 304 10*3/uL (ref 150.0–400.0)
RBC: 4.56 Mil/uL (ref 4.22–5.81)
RDW: 13.3 % (ref 11.5–15.5)
WBC: 6.9 10*3/uL (ref 4.0–10.5)

## 2023-09-15 LAB — HEMOGLOBIN A1C: Hgb A1c MFr Bld: 5.9 % (ref 4.6–6.5)

## 2023-09-15 LAB — PSA: PSA: 0.66 ng/mL (ref 0.10–4.00)

## 2023-10-14 ENCOUNTER — Encounter: Payer: Self-pay | Admitting: Gastroenterology

## 2023-10-21 ENCOUNTER — Ambulatory Visit (INDEPENDENT_AMBULATORY_CARE_PROVIDER_SITE_OTHER): Payer: BC Managed Care – PPO | Admitting: Internal Medicine

## 2023-10-22 ENCOUNTER — Encounter: Payer: Self-pay | Admitting: Gastroenterology

## 2023-10-22 ENCOUNTER — Ambulatory Visit: Payer: BC Managed Care – PPO | Admitting: Gastroenterology

## 2023-10-22 VITALS — BP 113/78 | HR 64 | Temp 98.4°F | Resp 12 | Ht 73.0 in | Wt 249.0 lb

## 2023-10-22 DIAGNOSIS — K317 Polyp of stomach and duodenum: Secondary | ICD-10-CM

## 2023-10-22 DIAGNOSIS — K2289 Other specified disease of esophagus: Secondary | ICD-10-CM | POA: Diagnosis not present

## 2023-10-22 DIAGNOSIS — K219 Gastro-esophageal reflux disease without esophagitis: Secondary | ICD-10-CM

## 2023-10-22 DIAGNOSIS — Z13810 Encounter for screening for upper gastrointestinal disorder: Secondary | ICD-10-CM | POA: Diagnosis present

## 2023-10-22 DIAGNOSIS — K449 Diaphragmatic hernia without obstruction or gangrene: Secondary | ICD-10-CM

## 2023-10-22 MED ORDER — SODIUM CHLORIDE 0.9 % IV SOLN: 500.0000 mL | INTRAVENOUS | Status: DC

## 2023-10-22 NOTE — Progress Notes (Signed)
 Called to room to assist during endoscopic procedure.  Patient ID and intended procedure confirmed with present staff. Received instructions for my participation in the procedure from the performing physician.

## 2023-10-22 NOTE — Progress Notes (Signed)
[facility] History and Physical   Primary Care Physician:  Sharlene Dory, DO   Reason for Procedure:   Chronic GERD/Barrett's screening  Plan:    EGD     HPI: Tristan Merritt is a 53 y.o. male with long standing GERD symptoms, currently controlled with once daily pantoprazole, undergoing EGD for Barrett's screening.    Past Medical History:  Diagnosis Date   Allergy    GERD (gastroesophageal reflux disease)    Joint pain    Muscle pain    Seasonal allergies     Past Surgical History:  Procedure Laterality Date   COLONOSCOPY     NASAL SINUS SURGERY  2000   Remove cyst   UPPER GASTROINTESTINAL ENDOSCOPY      Prior to Admission medications   Medication Sig Start Date End Date Taking? Authorizing Provider  cetirizine (ZYRTEC) 10 MG tablet Take 10 mg by mouth daily.   Yes [provider]  latanoprost (XALATAN) 0.005 % ophthalmic solution SMARTSIG:In Eye(s) 10/14/20  Yes [provider]  pantoprazole (PROTONIX) 40 MG tablet Take 1 tablet (40 mg total) by mouth daily. 06/25/23  Yes Sharlene Dory, DO    Current Outpatient Medications  Medication Sig Dispense Refill   cetirizine (ZYRTEC) 10 MG tablet Take 10 mg by mouth daily.     latanoprost (XALATAN) 0.005 % ophthalmic solution SMARTSIG:In Eye(s)     pantoprazole (PROTONIX) 40 MG tablet Take 1 tablet (40 mg total) by mouth daily. 90 tablet 1   Current Facility-Administered Medications  Medication Dose Route Frequency Provider Last Rate Last Admin   0.9 %  sodium chloride infusion  500 mL Intravenous Continuous Jenel Lucks, MD        Allergies as of 10/22/2023 - Review Complete 10/22/2023  Allergen Reaction Noted   Biaxin [clarithromycin] Nausea And Vomiting 08/19/2018   Sulfa antibiotics Hives 08/19/2018    Family History  Problem Relation Age of Onset   Cancer Mother    Lymphoma Mother    Obesity Mother    Stroke Father    Hypertension Father     Diabetes Father        Borderline   HIV Brother    Glaucoma Brother    Multiple myeloma Maternal Grandmother    Heart disease Maternal Grandfather    Hypertension Paternal Grandmother    Stroke Paternal Grandmother    Healthy Son    Healthy Son    Colon cancer Neg Hx    Esophageal cancer Neg Hx    Colon polyps Neg Hx    Rectal cancer Neg Hx    Stomach cancer Neg Hx     Social History   Socioeconomic History   Marital status: Media planner    Spouse name: girlfriend, Marlowe Aschoff   Number of children: 2   Years of education: Not on file   Highest education level: Bachelor's degree (e.g., BA, AB, BS)  Occupational History   Occupation: Engineer, structural: [employer]  Tobacco Use   Smoking status: Never   Smokeless tobacco: Never  Vaping Use   Vaping status: Never Used  Substance and Sexual Activity   Alcohol use: Not Currently    Alcohol/week: 1.0 - 2.0 standard drink of alcohol    Types: 1 - 2 Cans of beer per week    Comment: rarely   Drug use: Never   Sexual activity: Yes  Other Topics Concern   Not on file  Social History Narrative  Not on file   Social Drivers of Health   Financial Resource Strain: Low Risk  (05/27/2023)   Overall Financial Resource Strain (CARDIA)    Difficulty of Paying Living Expenses: Not very hard  Food Insecurity: No Food Insecurity (05/27/2023)   Hunger Vital Sign    Worried About Running Out of Food in the Last Year: Never true    Ran Out of Food in the Last Year: Never true  Transportation Needs: No Transportation Needs (05/27/2023)   PRAPARE - Administrator, Civil Service (Medical): No    Lack of Transportation (Non-Medical): No  Physical Activity: Insufficiently Active (05/27/2023)   Exercise Vital Sign    Days of Exercise per Week: 2 days    Minutes of Exercise per Session: 20 min  Stress: Stress Concern Present (05/27/2023)   [employer] of Occupational Health - Occupational Stress  Questionnaire    Feeling of Stress : To some extent  Social Connections: Socially Isolated (05/27/2023)   Social Connection and Isolation Panel [NHANES]    Frequency of Communication with Friends and Family: Once a week    Frequency of Social Gatherings with Friends and Family: Once a week    Attends Religious Services: Never    Database administrator or Organizations: No    Attends Engineer, structural: Not on file    Marital Status: Divorced  Intimate Partner Violence: Not on file    Review of Systems:  All other review of systems negative except as mentioned in the HPI.  Physical Exam: Vital signs BP 131/73   Pulse 75   Temp 98.4 F (36.9 C) (Skin)   Ht 6\' 1"  (1.854 m)   Wt 249 lb (112.9 kg)   SpO2 98%   BMI 32.85 kg/m   General:   Alert,  Well-developed, well-nourished, pleasant and cooperative in NAD Airway:  Mallampati 1 Lungs:  Clear throughout to auscultation.   Heart:  Regular rate and rhythm; no murmurs, clicks, rubs,  or gallops. Abdomen:  Soft, nontender and nondistended. Normal bowel sounds.   Neuro/Psych:  Normal mood and affect. A and O x 3   Tacarra Justo E. Tomasa Rand, MD [facility]

## 2023-10-22 NOTE — Progress Notes (Signed)
 Sedate, gd SR, tolerated procedure well, VSS, report to RN

## 2023-10-22 NOTE — Progress Notes (Signed)
 Patient states there have been no changes to medical or surgical history since time of pre-visit.

## 2023-10-22 NOTE — Op Note (Signed)
[facility] Patient Name: Tristan Merritt Procedure Date: 10/22/2023 9:33 AM MRN: [id number] Endoscopist: Lorin Picket E. Tomasa Rand , MD, [id number] Age: 53 Referring MD:  Date of Birth: 11/01/1970 Gender: Male Account #: [id number] Procedure:                Upper GI endoscopy Indications:              Screening for Barrett's esophagus Medicines:                Monitored Anesthesia Care Procedure:                Pre-Anesthesia Assessment:                           - Prior to the procedure, a History and Physical                            was performed, and patient medications and                            allergies were reviewed. The patient's tolerance of                            previous anesthesia was also reviewed. The risks                            and benefits of the procedure and the sedation                            options and risks were discussed with the patient.                            All questions were answered, and informed consent                            was obtained. Prior Anticoagulants: The patient has                            taken no anticoagulant or antiplatelet agents. ASA                            Grade Assessment: II - A patient with mild systemic                            disease. After reviewing the risks and benefits,                            the patient was deemed in satisfactory condition to                            undergo the procedure.                           After obtaining informed consent, the endoscope was  passed under direct vision. Throughout the                            procedure, the patient's blood pressure, pulse, and                            oxygen saturations were monitored continuously. The                            GIF [id number] #[id number] was introduced through the                            mouth, and advanced to the second part of duodenum.                            The upper GI  endoscopy was accomplished without                            difficulty. The patient tolerated the procedure                            well. Scope In: Scope Out: Findings:                 The examined portions of the nasopharynx,                            oropharynx and larynx were normal.                           The Z-line was irregular.                           The exam of the esophagus was otherwise normal.                           The gastroesophageal flap valve was visualized                            endoscopically and classified as Hill Grade IV (no                            fold, wide open lumen, hiatal hernia present).                           A few small sessile polyps were found in the                            cardia, in the gastric fundus and in the gastric                            body. The polyp in the cardia was removed with a                            cold snare.  Resection and retrieval were complete.                            Estimated blood loss was minimal.                           A 2 cm hiatal hernia was present.                           The exam of the stomach was otherwise normal.                           The examined duodenum was normal. Complications:            No immediate complications. Estimated Blood Loss:     Estimated blood loss was minimal. Impression:               - The examined portions of the nasopharynx,                            oropharynx and larynx were normal.                           - Z-line irregular. No evidence of Barrett's                            esophagus.                           - Gastroesophageal flap valve classified as Hill                            Grade IV (no fold, wide open lumen, hiatal hernia                            present).                           - A few gastric polyps. One was resected and                            retrieved. These appeared consistent with fundic                             gland polyps                           - 2 cm hiatal hernia.                           - Normal examined duodenum. Recommendation:           - Patient has a contact number available for                            emergencies. The signs and symptoms of potential  delayed complications were discussed with the                            patient. Return to normal activities tomorrow.                            Written discharge instructions were provided to the                            patient.                           - Resume previous diet.                           - Continue present medications.                           - Await pathology results. Nolia Tschantz E. Tomasa Rand, MD 10/22/2023 10:10:59 AM This report has been signed electronically.

## 2023-10-22 NOTE — Patient Instructions (Addendum)
-   Resume previous diet - Continue present medications. - Await pathology results - Patient given educational handouts related to procedure.  YOU HAD AN ENDOSCOPIC PROCEDURE TODAY AT THE [facility]:   Refer to the procedure report that was given to you for any specific questions about what was found during the examination.  If the procedure report does not answer your questions, please call your gastroenterologist to clarify.  If you requested that your care partner not be given the details of your procedure findings, then the procedure report has been included in a sealed envelope for you to review at your convenience later.  YOU SHOULD EXPECT: Some feelings of bloating in the abdomen. Passage of more gas than usual.  Walking can help get rid of the air that was put into your GI tract during the procedure and reduce the bloating. If you had a lower endoscopy (such as a colonoscopy or flexible sigmoidoscopy) you may notice spotting of blood in your stool or on the toilet paper. If you underwent a bowel prep for your procedure, you may not have a normal bowel movement for a few days.  Please Note:  You might notice some irritation and congestion in your nose or some drainage.  This is from the oxygen used during your procedure.  There is no need for concern and it should clear up in a day or so.  SYMPTOMS TO REPORT IMMEDIATELY: Following upper endoscopy (EGD)  Vomiting of blood or coffee ground material  New chest pain or pain under the shoulder blades  Painful or persistently difficult swallowing  New shortness of breath  Fever of 100F or higher  Black, tarry-looking stools  For urgent or emergent issues, a gastroenterologist can be reached at any hour by calling [phone]. Do not use MyChart messaging for urgent concerns.    DIET:  We do recommend a small meal at first, but then you may proceed to your regular diet.  Drink plenty of fluids but you should avoid alcoholic  beverages for 24 hours.  ACTIVITY:  You should plan to take it easy for the rest of today and you should NOT DRIVE or use heavy machinery until tomorrow (because of the sedation medicines used during the test).    FOLLOW UP: Our staff will call the number listed on your records the next business day following your procedure.  We will call around 7:15- 8:00 am to check on you and address any questions or concerns that you may have regarding the information given to you following your procedure. If we do not reach you, we will leave a message.     If any biopsies were taken you will be contacted by phone or by letter within the next 1-3 weeks.  Please call us at [phone] if you have not heard about the biopsies in 3 weeks.    SIGNATURES/CONFIDENTIALITY: You and/or your care partner have signed paperwork which will be entered into your electronic medical record.  These signatures attest to the fact that that the information above on your After Visit Summary has been reviewed and is understood.  Full responsibility of the confidentiality of this discharge information lies with you and/or your care-partner.

## 2023-10-23 ENCOUNTER — Telehealth: Payer: Self-pay

## 2023-10-23 NOTE — Telephone Encounter (Signed)
  Follow up Call-     10/22/2023    9:23 AM  Call back number  Post procedure Call Back phone  # [phone]  Permission to leave phone message Yes    Attempted to call regarding follow-up. No answer, VM left.

## 2023-10-26 LAB — SURGICAL PATHOLOGY

## 2023-11-01 ENCOUNTER — Encounter: Payer: Self-pay | Admitting: Gastroenterology

## 2023-11-01 NOTE — Progress Notes (Signed)
 Mr. Strozier,  The polyp resected from your stomach was a benign fundic gland polyp.  There was no evidence of infection with Helicobacter pylori or intestinal metaplasia/dysplasia.  These types of polyps are typically secondary to acid suppression therapy and no specific follow-up required for these small, benign polyps

## 2023-11-05 ENCOUNTER — Encounter (INDEPENDENT_AMBULATORY_CARE_PROVIDER_SITE_OTHER): Payer: Self-pay

## 2024-01-11 ENCOUNTER — Encounter: Payer: Self-pay | Admitting: Family Medicine

## 2024-01-11 ENCOUNTER — Other Ambulatory Visit: Payer: Self-pay | Admitting: Family Medicine

## 2024-01-11 MED ORDER — PANTOPRAZOLE SODIUM 20 MG PO TBEC: 20.0000 mg | DELAYED_RELEASE_TABLET | Freq: Every day | ORAL | 1 refills | Status: DC

## 2024-04-21 ENCOUNTER — Other Ambulatory Visit: Payer: Self-pay | Admitting: Family Medicine

## 2024-09-14 ENCOUNTER — Encounter: Payer: Self-pay | Admitting: Family Medicine

## 2024-09-14 ENCOUNTER — Ambulatory Visit: Payer: BC Managed Care – PPO | Admitting: Family Medicine

## 2024-09-14 VITALS — BP 120/80 | HR 100 | Temp 98.0°F | Resp 16 | Ht 73.0 in | Wt 243.6 lb

## 2024-09-14 DIAGNOSIS — Z23 Encounter for immunization: Secondary | ICD-10-CM | POA: Diagnosis not present

## 2024-09-14 DIAGNOSIS — Z125 Encounter for screening for malignant neoplasm of prostate: Secondary | ICD-10-CM | POA: Diagnosis not present

## 2024-09-14 DIAGNOSIS — Z0000 Encounter for general adult medical examination without abnormal findings: Secondary | ICD-10-CM

## 2024-09-14 DIAGNOSIS — R7303 Prediabetes: Secondary | ICD-10-CM | POA: Diagnosis not present

## 2024-09-14 NOTE — Progress Notes (Signed)
 Chief Complaint  Patient presents with   Annual Exam    CPE    Well Male Tristan Merritt is here for a complete physical.   His last physical was >1 year ago.  Current diet: in general, a healthy diet.  Current exercise: limited Weight trend: intentionally losing Fatigue out of ordinary? No. Seat belt? Yes.   Advanced directive? No  Health maintenance Shingrix- No Colonoscopy- Yes Tetanus- Yes HIV- Yes Hep C- Yes   Past Medical History:  Diagnosis Date   Allergy    GERD (gastroesophageal reflux disease)    Joint pain    Muscle pain    Seasonal allergies       Past Surgical History:  Procedure Laterality Date   COLONOSCOPY     NASAL SINUS SURGERY  2000   Remove cyst   UPPER GASTROINTESTINAL ENDOSCOPY      Medications  Medications Ordered Prior to Encounter[1]   Allergies Allergies[2]  Family History Family History  Problem Relation Age of Onset   Cancer Mother    Lymphoma Mother    Obesity Mother    Stroke Father    Hypertension Father    Diabetes Father        Borderline   HIV Brother    Glaucoma Brother    Multiple myeloma Maternal Grandmother    Heart disease Maternal Grandfather    Hypertension Paternal Grandmother    Stroke Paternal Grandmother    Healthy Son    Healthy Son    Colon cancer Neg Hx    Esophageal cancer Neg Hx    Colon polyps Neg Hx    Rectal cancer Neg Hx    Stomach cancer Neg Hx     Review of Systems: Constitutional:  no fevers Eye:  no recent significant change in vision Ear/Nose/Mouth/Throat:  Ears:  no hearing loss Nose/Mouth/Throat:  no complaints of nasal congestion, no sore throat Cardiovascular:  no chest pain Respiratory:  no shortness of breath Gastrointestinal:  no change in bowel habits GU:  Male: negative for dysuria, frequency Musculoskeletal/Extremities:  no joint pain Integumentary (Skin/Breast):  no abnormal skin lesions reported Neurologic:  no headaches Endocrine: No unexpected weight  changes Hematologic/Lymphatic:  no abnormal bleeding  Exam BP 120/80 (BP Location: Left Arm, Patient Position: Sitting)   Pulse 100   Temp 98 F (36.7 C) (Oral)   Resp 16   Ht 6' 1 (1.854 m)   Wt 243 lb 9.6 oz (110.5 kg)   SpO2 95%   BMI 32.14 kg/m  General:  well developed, well nourished, in no apparent distress Skin:  no significant moles, warts, or growths Head:  no masses, lesions, or tenderness Eyes:  pupils equal and round, sclera anicteric without injection Ears:  canals without lesions, TMs shiny without retraction, no obvious effusion, no erythema Nose:  nares patent, mucosa normal Throat/Pharynx:  lips and gingiva without lesion; tongue and uvula midline; non-inflamed pharynx; no exudates or postnasal drainage Neck: neck supple without adenopathy, thyromegaly, or masses Cardiac: RRR, no bruits, no LE edema Lungs:  clear to auscultation, breath sounds equal bilaterally, no respiratory distress Abdomen: BS+, soft, non-tender, non-distended, no masses or organomegaly noted Rectal: Deferred Musculoskeletal:  symmetrical muscle groups noted without atrophy or deformity Neuro:  gait normal; deep tendon reflexes normal and symmetric Psych: well oriented with normal range of affect and appropriate judgment/insight  Assessment and Plan  Well adult exam - Plan: CBC, Comprehensive metabolic panel with GFR, Lipid panel, Hepatitis B surface antibody,quantitative  Prediabetes -  Plan: Hemoglobin A1c  Screening for prostate cancer - Plan: PSA   [name]. Counseled on diet and exercise. Counseled on risks and benefits of prostate cancer screening with PSA. The patient agrees to undergo testing. Advanced directive form provided today.  PCV20 today. Shingrix rec'd.  Immunizations, labs, and further orders as above. Follow up in 1 yr. The patient voiced understanding and agreement to the plan.  Tristan Merritt [place], DO 09/14/2024 2:54 PM     [1]  Current  Outpatient Medications on File Prior to Visit  Medication Sig Dispense Refill   cetirizine (ZYRTEC) 10 MG tablet Take 10 mg by mouth daily.     latanoprost (XALATAN) 0.005 % ophthalmic solution SMARTSIG:In Eye(s)     pantoprazole  (PROTONIX ) 20 MG tablet TAKE 1 TABLET(20 MG) BY MOUTH DAILY 90 tablet 1   No current facility-administered medications on file prior to visit.  [2]  Allergies Allergen Reactions   Biaxin [Clarithromycin] Nausea And Vomiting   Sulfa Antibiotics Hives

## 2024-09-14 NOTE — Addendum Note (Signed)
 Addended by: Amedio Bowlby M on: 09/14/2024 03:16 PM   Modules accepted: Orders

## 2024-09-14 NOTE — Patient Instructions (Signed)

## 2024-09-15 ENCOUNTER — Ambulatory Visit: Payer: Self-pay | Admitting: Family Medicine

## 2024-09-15 LAB — CBC
HCT: 45.1 % (ref 39.0–52.0)
Hemoglobin: 15.2 g/dL (ref 13.0–17.0)
MCHC: 33.6 g/dL (ref 30.0–36.0)
MCV: 86 fl (ref 78.0–100.0)
Platelets: 319 K/uL (ref 150.0–400.0)
RBC: 5.25 Mil/uL (ref 4.22–5.81)
RDW: 12.9 % (ref 11.5–15.5)
WBC: 7.3 K/uL (ref 4.0–10.5)

## 2024-09-15 LAB — COMPREHENSIVE METABOLIC PANEL WITH GFR
ALT: 21 U/L (ref 3–53)
AST: 16 U/L (ref 5–37)
Albumin: 4.8 g/dL (ref 3.5–5.2)
Alkaline Phosphatase: 66 U/L (ref 39–117)
BUN: 14 mg/dL (ref 6–23)
CO2: 32 meq/L (ref 19–32)
Calcium: 10.4 mg/dL (ref 8.4–10.5)
Chloride: 99 meq/L (ref 96–112)
Creatinine, Ser: 1.11 mg/dL (ref 0.40–1.50)
GFR: 75.13 mL/min
Glucose, Bld: 88 mg/dL (ref 70–99)
Potassium: 4 meq/L (ref 3.5–5.1)
Sodium: 141 meq/L (ref 135–145)
Total Bilirubin: 0.4 mg/dL (ref 0.2–1.2)
Total Protein: 7.7 g/dL (ref 6.0–8.3)

## 2024-09-15 LAB — LIPID PANEL
Cholesterol: 188 mg/dL (ref 28–200)
HDL: 57.6 mg/dL
LDL Cholesterol: 109 mg/dL — ABNORMAL HIGH (ref 10–99)
NonHDL: 130.78
Total CHOL/HDL Ratio: 3
Triglycerides: 111 mg/dL (ref 10.0–149.0)
VLDL: 22.2 mg/dL (ref 0.0–40.0)

## 2024-09-15 LAB — PSA: PSA: 0.66 ng/mL (ref 0.10–4.00)

## 2024-09-15 LAB — HEPATITIS B SURFACE ANTIBODY, QUANTITATIVE: Hep B S AB Quant (Post): <5 m[IU]/mL — ABNORMAL LOW

## 2024-09-15 LAB — HEMOGLOBIN A1C: Hgb A1c MFr Bld: 5.6 % (ref 4.6–6.5)

## 2024-09-15 NOTE — Telephone Encounter (Signed)
 Left message on machine for pt to call back to schedule nurse visit and also mychart message sent.

## 2025-09-19 ENCOUNTER — Encounter: Admitting: Family Medicine
